# Patient Record
Sex: Female | Born: 1960 | Hispanic: No | Marital: Married | Smoking: Never smoker
Health system: Southern US, Community
[De-identification: ages and names within clinical notes are randomized; demographics above are authoritative.]

## PROBLEM LIST (undated history)

## (undated) DIAGNOSIS — E079 Disorder of thyroid, unspecified: Secondary | ICD-10-CM

---

## 1999-06-10 ENCOUNTER — Emergency Department (HOSPITAL_COMMUNITY): Admission: EM | Admit: 1999-06-10 | Discharge: 1999-06-10 | Payer: Self-pay

## 2003-11-19 ENCOUNTER — Ambulatory Visit (HOSPITAL_COMMUNITY): Admission: RE | Admit: 2003-11-19 | Discharge: 2003-11-19 | Payer: Self-pay | Admitting: Internal Medicine

## 2003-11-20 ENCOUNTER — Ambulatory Visit: Payer: Self-pay | Admitting: Family Medicine

## 2003-11-20 ENCOUNTER — Ambulatory Visit: Payer: Self-pay | Admitting: *Deleted

## 2004-05-15 ENCOUNTER — Ambulatory Visit: Payer: Self-pay | Admitting: Family Medicine

## 2013-06-10 ENCOUNTER — Encounter (HOSPITAL_COMMUNITY): Payer: Self-pay | Admitting: Emergency Medicine

## 2013-06-10 ENCOUNTER — Emergency Department (HOSPITAL_COMMUNITY)
Admission: EM | Admit: 2013-06-10 | Discharge: 2013-06-10 | Disposition: A | Discharge status: Home / Self Care | Payer: Self-pay | Source: Home / Self Care

## 2013-06-10 DIAGNOSIS — M25569 Pain in unspecified knee: Secondary | ICD-10-CM

## 2013-06-10 DIAGNOSIS — M76892 Other specified enthesopathies of left lower limb, excluding foot: Secondary | ICD-10-CM

## 2013-06-10 DIAGNOSIS — M25562 Pain in left knee: Secondary | ICD-10-CM

## 2013-06-10 DIAGNOSIS — M6580 Other synovitis and tenosynovitis, unspecified site: Secondary | ICD-10-CM

## 2013-06-10 DIAGNOSIS — X58XXXA Exposure to other specified factors, initial encounter: Secondary | ICD-10-CM

## 2013-06-10 DIAGNOSIS — S76312A Strain of muscle, fascia and tendon of the posterior muscle group at thigh level, left thigh, initial encounter: Secondary | ICD-10-CM

## 2013-06-10 DIAGNOSIS — IMO0002 Reserved for concepts with insufficient information to code with codable children: Secondary | ICD-10-CM

## 2013-06-10 DIAGNOSIS — G8929 Other chronic pain: Secondary | ICD-10-CM

## 2013-06-10 HISTORY — DX: Disorder of thyroid, unspecified: E07.9

## 2013-06-10 MED ORDER — TRAMADOL HCL 50 MG PO TABS: 50.0000 mg | ORAL_TABLET | Freq: Four times a day (QID) | ORAL | Status: DC | PRN

## 2013-06-10 NOTE — ED Notes (Deleted)
Pt  Reports  Symptoms  Of  sotrethroat  Wit  White  Patches  Back of  Throat   With  Symptoms  For  Several  Days   Pt          Appears  In no  Acute  Distress    Sitting  Upright on  Exam table  Speaking in  Complete  sentances

## 2013-06-10 NOTE — ED Provider Notes (Signed)
CSN: [id number]     Arrival date & time 06/10/13  1901 History   First MD Initiated Contact with Patient 06/10/13 2004     Chief Complaint  Patient presents with  . Leg Pain   (Consider location/radiation/quality/duration/timing/severity/associated sxs/prior Treatment) HPI Comments: 53 year old Spanish female with chronic left knee pain previously diagnosed with chronic hamstring tendinitis. She has been seeing a physician for several months and is not improving. Today she was taking a step down and produced acute pain in the back of the knee. She did not fall. She does not  described in overt extension or torsion type injury. She is also requesting that we do something for her chronic knee pain and she is not satisfied with her current physician.    Past Medical History  Diagnosis Date  . Thyroid disease    History reviewed. No pertinent past surgical history. History reviewed. No pertinent family history. History  Substance Use Topics  . Smoking status: Never Smoker   . Smokeless tobacco: Not on file  . Alcohol Use: Yes   OB History   Grav Para Term Preterm Abortions TAB SAB Ect Mult Living                 Review of Systems  Constitutional: Negative for fever, chills and activity change.  HENT: Negative.   Respiratory: Negative.   Cardiovascular: Negative.   Musculoskeletal: Negative for joint swelling.       As per HPI  Skin: Negative for color change, pallor and rash.  Neurological: Negative.     Allergies  Review of patient's allergies indicates no known allergies.  Home Medications   Prior to Admission medications   Medication Sig Start Date End Date Taking? Authorizing Provider  DiphenhydrAMINE HCl (BENADRYL PO) Take by mouth.    Historical Provider, MD  Ibuprofen (ADVIL PO) Take by mouth.    Historical Provider, MD   BP 125/80  Pulse 76  Temp(Src) 98.9 F (37.2 C) (Oral)  Resp 18  SpO2 96% Physical Exam  Nursing note and vitals  reviewed. Constitutional: She is oriented to person, place, and time. She appears well-developed and well-nourished. No distress.  HENT:  Head: Normocephalic and atraumatic.  Neck: Normal range of motion. Neck supple.  Pulmonary/Chest: Effort normal. No respiratory distress.  Musculoskeletal:  No bony tenderness. No deformity or discoloration. Full range of motion. Tenderness to the posterior fossa hamstring tendons. There is also tenderness to the distal hamstring muscle. No joint laxity. Distal neurovascular motor sensory is intact.  Neurological: She is alert and oriented to person, place, and time. No cranial nerve deficit.  Skin: Skin is warm and dry.    ED Course  Procedures (including critical care time) Labs Review Labs Reviewed - No data to display  No results found for this or any previous visit. Imaging Review No results found.   MDM   1. Tendinitis of left knee   2. Left hamstring muscle strain   3. Chronic pain of left knee    Applied to the area of soreness behind the knee Tylenol for pain Continue the diclofenac PC. Also start omeprazole daily. Refer to Dr. Sharol Given as requested    Janne Napoleon, NP 06/10/13 2024

## 2013-06-10 NOTE — ED Notes (Signed)
Pt c/o left leg pain/posterior to knee pain onset 1300 Reports she was going upstairs when she took a misstep and hurt her leg Reports hx of tendonitis; pain increases w/activity Brought back in wheelchair; cane also present Alert w/no signs of acute distress.

## 2013-06-10 NOTE — ED Notes (Signed)
Above  Notes  By  This  Probation officer  Charted  In  Error

## 2013-06-10 NOTE — Discharge Instructions (Signed)
Hamstring Strain  Hamstrings are the large muscles in the back of the thighs. A strain or tear injury happens when there is a sudden stretch or pull on these muscles and tendons. Tendons are cord like structures that attach muscle to bone. These injuries are commonly seen in activities such as sprinting due to sudden acceleration.  DIAGNOSIS  Often the diagnosis can be made by examination. HOME CARE INSTRUCTIONS   Apply ice to the sore area for 15-30minutes, 03-04 times per day. Do this while awake for the first 2 days. Put the ice in a plastic bag, and place a towel between the bag of ice and your skin.  Keep your knee flexed when possible. This means your foot is held off the ground slightly if you are on crutches. When lying down, a pillow under the knee will take strain off the muscles and provide some relief.  If a compression bandage such as an ace wrap was applied, use it until you are seen again. You may remove it for sleeping, showers and baths. If the wrap seems to be too tight and is uncomfortable, wrap it more loosely. If your toes or foot are getting cold or blue, it is too tight.  Walk or move around as the pain allows, or as instructed. Resume full activities as suggested by your caregiver. This is often safest when the strength of the injured leg has nearly returned to normal.  Only take over-the-counter or prescription medicines for pain, discomfort, or fever as directed by your caregiver. SEEK MEDICAL CARE IF:   You have an increase in bruising, swelling or pain.  You notice coldness or blueness of your toes or foot.  Pain relief is not obtained with medications.  You have increasing pain in the area and seem to be getting worse rather than better.  You notice your thigh getting larger in size (this could indicate bleeding into the muscle). Document Released: 11/04/2000 Document Revised: 05/04/2011 Document Reviewed: 02/12/2008 [facility] Patient Information 2014  [place], [place].  Knee Pain Knee pain can be a result of an injury or other medical conditions. Treatment will depend on the cause of your pain. HOME CARE  Only take medicine as told by your doctor.  Keep a healthy weight. Being overweight can make the knee hurt more.  Stretch before exercising or playing sports.  If there is constant knee pain, change the way you exercise. Ask your doctor for advice.  Make sure shoes fit well. Choose the right shoe for the sport or activity.  Protect your knees. Wear kneepads if needed.  Rest when you are tired. GET HELP RIGHT AWAY IF:   Your knee pain does not stop.  Your knee pain does not get better.  Your knee joint feels hot to the touch.  You have a fever. MAKE SURE YOU:   Understand these instructions.  Will watch this condition.  Will get help right away if you are not doing well or get worse. Document Released: 05/08/2008 Document Revised: 05/04/2011 Document Reviewed: 05/08/2008 [facility] Patient Information 2014 [place], [place].  Tendinitis Tendinitis is swelling and inflammation of the tendons. Tendons are band-like tissues that connect muscle to bone. Tendinitis commonly occurs in the:   Shoulders (rotator cuff).  Heels (Achilles tendon).  Elbows (triceps tendon). CAUSES Tendinitis is usually caused by overusing the tendon, muscles, and joints involved. When the tissue surrounding a tendon (synovium) becomes inflamed, it is called tenosynovitis. Tendinitis commonly develops in people whose jobs require repetitive motions. SYMPTOMS  Pain.  Tenderness.  Mild swelling. DIAGNOSIS Tendinitis is usually diagnosed by physical exam. Your caregiver may also order X-rays or other imaging tests. TREATMENT Your caregiver may recommend certain medicines or exercises for your treatment. HOME CARE INSTRUCTIONS   Use a sling or splint for as long as directed by your caregiver until the pain decreases.  Put ice on the injured  area.  Put ice in a plastic bag.  Place a towel between your skin and the bag.  Leave the ice on for 15-20 minutes, 03-04 times a day.  Avoid using the limb while the tendon is painful. Perform gentle range of motion exercises only as directed by your caregiver. Stop exercises if pain or discomfort increase, unless directed otherwise by your caregiver.  Only take over-the-counter or prescription medicines for pain, discomfort, or fever as directed by your caregiver. SEEK MEDICAL CARE IF:   Your pain and swelling increase.  You develop new, unexplained symptoms, especially increased numbness in the hands. MAKE SURE YOU:   Understand these instructions.  Will watch your condition.  Will get help right away if you are not doing well or get worse. Document Released: 02/07/2000 Document Revised: 05/04/2011 Document Reviewed: 04/28/2010 [facility] Patient Information 2014 [place], [place].

## 2013-06-12 NOTE — ED Provider Notes (Signed)
Medical screening examination/treatment/procedure(s) were performed by a resident physician or non-physician practitioner and as the supervising physician I was immediately available for consultation/collaboration.  Lynne Leader, MD    Gregor Hams, MD 06/12/13 [phone]

## 2014-03-18 ENCOUNTER — Emergency Department (HOSPITAL_COMMUNITY): Payer: Self-pay

## 2014-03-18 ENCOUNTER — Emergency Department (HOSPITAL_COMMUNITY)
Admission: EM | Admit: 2014-03-18 | Discharge: 2014-03-18 | Disposition: A | Discharge status: Home / Self Care | Payer: Self-pay | Attending: Emergency Medicine | Admitting: Emergency Medicine

## 2014-03-18 ENCOUNTER — Encounter (HOSPITAL_COMMUNITY): Payer: Self-pay | Admitting: Family Medicine

## 2014-03-18 DIAGNOSIS — H81399 Other peripheral vertigo, unspecified ear: Secondary | ICD-10-CM | POA: Insufficient documentation

## 2014-03-18 DIAGNOSIS — R110 Nausea: Secondary | ICD-10-CM | POA: Insufficient documentation

## 2014-03-18 DIAGNOSIS — E876 Hypokalemia: Secondary | ICD-10-CM | POA: Insufficient documentation

## 2014-03-18 DIAGNOSIS — Z8639 Personal history of other endocrine, nutritional and metabolic disease: Secondary | ICD-10-CM | POA: Insufficient documentation

## 2014-03-18 DIAGNOSIS — R42 Dizziness and giddiness: Secondary | ICD-10-CM

## 2014-03-18 LAB — CBC
HEMATOCRIT: 39.2 % (ref 36.0–46.0)
Hemoglobin: 13.6 g/dL (ref 12.0–15.0)
MCH: 29.8 pg (ref 26.0–34.0)
MCHC: 34.7 g/dL (ref 30.0–36.0)
MCV: 85.8 fL (ref 78.0–100.0)
Platelets: 250 10*3/uL (ref 150–400)
RBC: 4.57 MIL/uL (ref 3.87–5.11)
RDW: 13.7 % (ref 11.5–15.5)
WBC: 4.7 10*3/uL (ref 4.0–10.5)

## 2014-03-18 LAB — BASIC METABOLIC PANEL
ANION GAP: 8 (ref 5–15)
BUN: 10 mg/dL (ref 6–23)
CHLORIDE: 105 mmol/L (ref 96–112)
CO2: 25 mmol/L (ref 19–32)
CREATININE: 0.55 mg/dL (ref 0.50–1.10)
Calcium: 9.2 mg/dL (ref 8.4–10.5)
GFR calc Af Amer: >90 mL/min (ref >90)
GLUCOSE: 101 mg/dL — AB (ref 70–99)
Potassium: 3.4 mmol/L — ABNORMAL LOW (ref 3.5–5.1)
SODIUM: 138 mmol/L (ref 135–145)

## 2014-03-18 MED ORDER — MECLIZINE HCL 50 MG PO TABS: 25.0000 mg | ORAL_TABLET | Freq: Three times a day (TID) | ORAL | Status: DC | PRN

## 2014-03-18 MED ORDER — MECLIZINE HCL 25 MG PO TABS
25.0000 mg | ORAL_TABLET | Freq: Once | ORAL | Status: AC
  Administered 2014-03-18: 25 mg via ORAL
  Filled 2014-03-18: qty 1

## 2014-03-18 NOTE — ED Provider Notes (Signed)
CSN: [id number]     Arrival date & time 03/18/14  1407 History   First MD Initiated Contact with Patient 03/18/14 1646     Chief Complaint  Patient presents with  . Dizziness     (Consider location/radiation/quality/duration/timing/severity/associated sxs/prior Treatment) Patient is a 54 y.o. female presenting with dizziness. The history is provided by the patient. No language interpreter was used.  Dizziness Quality:  Room spinning Severity:  Severe Onset quality:  Sudden Duration: First episode 6 months ago, then 2 months ago.  Current episode started 3 days ago. Timing:  Intermittent Progression:  Waxing and waning Chronicity:  Recurrent Context: bending over, head movement and standing up   Relieved by:  Being still Worsened by:  Movement Associated symptoms: nausea   Associated symptoms: no chest pain, no diarrhea, no headaches, no hearing loss, no palpitations, no shortness of breath, no syncope, no tinnitus, no vision changes and no vomiting     Past Medical History  Diagnosis Date  . Thyroid disease    History reviewed. No pertinent past surgical history. History reviewed. No pertinent family history. History  Substance Use Topics  . Smoking status: Never Smoker   . Smokeless tobacco: Not on file  . Alcohol Use: Yes   OB History    No data available     Review of Systems  Constitutional: Negative for fever, chills, diaphoresis, activity change, appetite change and fatigue.  HENT: Negative for congestion, facial swelling, hearing loss, rhinorrhea, sore throat and tinnitus.   Eyes: Negative for photophobia and discharge.  Respiratory: Negative for cough, chest tightness and shortness of breath.   Cardiovascular: Negative for chest pain, palpitations, leg swelling and syncope.  Gastrointestinal: Positive for nausea. Negative for vomiting, abdominal pain and diarrhea.  Endocrine: Negative for polydipsia and polyuria.  Genitourinary: Negative for dysuria,  frequency, difficulty urinating and pelvic pain.  Musculoskeletal: Negative for back pain, arthralgias, neck pain and neck stiffness.  Skin: Negative for color change and wound.  Allergic/Immunologic: Negative for immunocompromised state.  Neurological: Positive for dizziness. Negative for facial asymmetry, weakness, numbness and headaches.  Hematological: Does not bruise/bleed easily.  Psychiatric/Behavioral: Negative for confusion and agitation.      Allergies  Review of patient's allergies indicates no known allergies.  Home Medications   Prior to Admission medications   Medication Sig Start Date End Date Taking? Authorizing Provider  DiphenhydrAMINE HCl (BENADRYL PO) Take by mouth.    Historical Provider, MD  Ibuprofen (ADVIL PO) Take by mouth.    Historical Provider, MD  meclizine (ANTIVERT) 50 MG tablet Take 0.5 tablets (25 mg total) by mouth 3 (three) times daily as needed for dizziness. 03/18/14   Ernestina Patches, MD  traMADol (ULTRAM) 50 MG tablet Take 1 tablet (50 mg total) by mouth every 6 (six) hours as needed. 06/10/13   Janne Napoleon, NP   BP 113/57 mmHg  Pulse 63  Temp(Src) 98.1 F (36.7 C) (Oral)  Resp 18  Wt 144 lb (65.318 kg)  SpO2 99% Physical Exam  Constitutional: She is oriented to person, place, and time. She appears well-developed and well-nourished. No distress.  HENT:  Head: Normocephalic and atraumatic.  Mouth/Throat: No oropharyngeal exudate.  Eyes: Pupils are equal, round, and reactive to light.  Neck: Normal range of motion. Neck supple.  Cardiovascular: Normal rate, regular rhythm and normal heart sounds.  Exam reveals no gallop and no friction rub.   No murmur heard. Pulmonary/Chest: Effort normal and breath sounds normal. No respiratory distress. She has no  wheezes. She has no rales.  Abdominal: Soft. Bowel sounds are normal. She exhibits no distension and no mass. There is no tenderness. There is no rebound and no guarding.  Musculoskeletal: Normal  range of motion. She exhibits no edema or tenderness.  Neurological: She is alert and oriented to person, place, and time. She has normal strength. She displays no atrophy and no tremor. No cranial nerve deficit or sensory deficit. She exhibits normal muscle tone. She displays a negative Romberg sign. Coordination and gait normal. GCS eye subscore is 4. GCS verbal subscore is 5. GCS motor subscore is 6.  Mildly symptomatic Dix Hallpike on the left more strongly positive on right.  No nystagmus appreciated bilaterally  Skin: Skin is warm and dry.  Psychiatric: She has a normal mood and affect.    ED Course  Procedures (including critical care time) Labs Review Labs Reviewed  BASIC METABOLIC PANEL - Abnormal; Notable for the following:    Potassium 3.4 (*)    Glucose, Bld 101 (*)    All other components within normal limits  CBC    Imaging Review Ct Head Wo Contrast  03/18/2014   CLINICAL DATA:  dizziness over the past few months. sts random and when she moves her head  No hx of ca  EXAM: CT HEAD WITHOUT CONTRAST  TECHNIQUE: Contiguous axial images were obtained from the base of the skull through the vertex without intravenous contrast.  COMPARISON:  None.  FINDINGS: Ventricles normal in size and configuration. There are no parenchymal masses or mass effect. There are no areas of abnormal parenchymal attenuation. No evidence of an infarct.  There are no extra-axial masses or abnormal fluid collections.  There is no intracranial hemorrhage.  Visualized sinuses and mastoid air cells are clear. No skull lesion.  IMPRESSION: Normal unenhanced CT scan the brain   Electronically Signed   By: Lajean Manes M.D.   On: 03/18/2014 19:00     EKG Interpretation   Date/Time:  Sunday March 18 2014 14:11:32 EST Ventricular Rate:  71 PR Interval:  132 QRS Duration: 68 QT Interval:  404 QTC Calculation: 439 R Axis:   80 Text Interpretation:  Normal sinus rhythm Low voltage QRS Nonspecific ST   abnormality Abnormal ECG No prior for comparison Confirmed by DOCHERTY   MD, MEGAN ([id number]) on 03/18/2014 4:47:37 PM      MDM   Final diagnoses:  Vertigo  Peripheral vertigo, unspecified laterality    Pt is a 54 y.o. female with Pmhx as above who presents with episodic vertigo, worse with head movement and change in position.  She's had no recent head injuries or falls.  No numbness, weakness, confusion, decreased hearing tinnitus, ear pain.  On physical exam, vital signs are stable and she is in no acute distress.  Dix-Hallpike is mildly positive on the right more strongly positive (I cannot appreciate nystagmus.  Neuro exam otherwise unremarkable.  CBC and BMP from triage with mild hypokalemia, otherwise unremarkable.  CT head nml.  Suspect BPPV.  Will give trial prescription for meclizine and also instructions for home Epley maneuver.  [facility] as well as to ENT for continued symptoms.     Hassell Halim evaluation in the Emergency Department is complete. It has been determined that no acute conditions requiring further emergency intervention are present at this time. The patient/guardian have been advised of the diagnosis and plan. We have discussed signs and symptoms that  warrant return to the ED, such as changes or worsening in symptoms, numbness, weakness, confusion,       Ernestina Patches, MD 03/18/14 1921

## 2014-03-18 NOTE — ED Notes (Signed)
Pt sts intermittent dizziness over the past few months. sts random and when she moves her head fast.

## 2014-03-18 NOTE — ED Notes (Signed)
MD Docherty at bedside. 

## 2014-03-18 NOTE — Discharge Instructions (Signed)
Vrtigo postural benigno (Benign Positional Vertigo)  Vrtigo es la sensacin de que el entorno se mueve estando quieto. Es la forma ms frecuente de vrtigo. Benigno significa que la causa del trastorno no es grave. Es ms frecuente en adultos mayores. CAUSAS  Es el resultado de un trastorno en el sistema laberntico. Es una zona en el odo medio que ayuda a controlar el equilibrio. La causa puede ser una infeccin viral, una lesin en la cabeza o un movimiento repetitivo. Sin embargo, a menudo no se Research scientist (life sciences).  SNTOMAS  Los sntomas de vrtigo posicional benigno se producen al mover la cabeza o los ojos en diferentes direcciones. Algunos de los sntomas pueden ser:  1. Prdida de equilibrio y cadas. 2. Vmitos. 3. Visin borrosa. 4. Mareos. 5. Nuseas. 6. Movimientos oculares involuntarios (nistagmus). DIAGNSTICO  El vrtigo postural benigno se diagnostica mediante un examen fsico. Si la causa especfica de su vrtigo posicional benigno es desconocido, su mdico puede indicar diagnsticos por imgenes, como la Health visitor (RM) o la tomografa computada (TC).  TRATAMIENTO  El [employer] podr recomendar movimientos o procedimientos para corregir el vrtigo posicional benigno. Para tratar los sntomas pueden indicarse medicamentos como meclizina, benzodiazepinas y medicamentos para las nuseas. En casos raros, si los sntomas son causados   por ciertos trastornos que afectan el odo interno, es posible que necesite [place].  [place] indicaciones del mdico.  Muvase lentamente. No haga movimientos bruscos con la cabeza ni el cuerpo.  Evite conducir vehculos.  Evite operar maquinarias pesadas.  Evite realizar tareas que seran peligrosas para usted u otras personas durante un episodio de vrtigo.  Debe ingerir gran cantidad de lquido para mantener la orina de tono claro o color amarillo plido. SOLICITE ATENCIN [place] DE  INMEDIATO SI:   Tiene dificultad para hablar, caminar, siente debilidad o tiene problemas para usar los brazos, las [place] piernas.  Tiene dificultad para respirar.  Sufre un dolor de cabeza intenso.  Las nuseas o los vmitos persisten o [place].  Tiene cambios en la visin.  Sus familiares o amigos notan cambios en su conducta.  El dolor [place].  Tiene fiebre.  Comienza a sentir rigidez en el cuello o sensibilidad a la luz. ASEGRESE DE QUE:   Comprende estas instrucciones.  Controlar su enfermedad.  Solicitar ayuda de inmediato si no mejora o si empeora. Document Released: 05/28/2008 Document Revised: 05/04/2011 [facility] Patient Information 2015 [place]. This information is not intended to replace advice given to you by your health care provider. Make sure you discuss any questions you have with your health care provider. Benign Positional Vertigo Vertigo means you feel like you or your surroundings are moving when they are not. Benign positional vertigo is the most common form of vertigo. Benign means that the cause of your condition is not serious. Benign positional vertigo is more common in older adults. CAUSES  Benign positional vertigo is the result of an upset in the labyrinth system. This is an area in the middle ear that helps control your balance. This may be caused by a viral infection, head injury, or repetitive motion. However, often no specific cause is found. SYMPTOMS  Symptoms of benign positional vertigo occur when you move your head or eyes in different directions. Some of the symptoms may include: 7. Loss of balance and falls. 8. Vomiting. 9. Blurred vision. 10. Dizziness. 11. Nausea. 12. Involuntary eye movements (nystagmus). DIAGNOSIS  Benign positional  vertigo is usually diagnosed by physical exam. If the specific cause of your benign positional vertigo is unknown, your caregiver may perform imaging tests, such as magnetic resonance  imaging (MRI) or computed tomography (CT). TREATMENT  Your caregiver may recommend movements or procedures to correct the benign positional vertigo. Medicines such as meclizine, benzodiazepines, and medicines for nausea may be used to treat your symptoms. In rare cases, if your symptoms are caused by certain conditions that affect the inner ear, you may need surgery. HOME CARE INSTRUCTIONS   Follow your caregiver's instructions.  Move slowly. Do not make sudden body or head movements.  Avoid driving.  Avoid operating heavy machinery.  Avoid performing any tasks that would be dangerous to you or others during a vertigo episode.  Drink enough fluids to keep your urine clear or pale yellow. SEEK IMMEDIATE MEDICAL CARE IF:   You develop problems with walking, weakness, numbness, or using your arms, hands, or legs.  You have difficulty speaking.  You develop severe headaches.  Your nausea or vomiting continues or gets worse.  You develop visual changes.  Your family or friends notice any behavioral changes.  Your condition gets worse.  You have a fever.  You develop a stiff neck or sensitivity to light. MAKE SURE YOU:   Understand these instructions.  Will watch your condition.  Will get help right away if you are not doing well or get worse. Document Released: 11/17/2005 Document Revised: 05/04/2011 Document Reviewed: 10/30/2010 [facility] Patient Information 2015 [place], [place]. This information is not intended to replace advice given to you by your health care provider. Make sure you discuss any questions you have with your health care provider. Maniobra de Engineer, petroleum, cuidado personal Immunologist Self-Care) QU ES LA MANIOBRA DE EPLEY? La Yahoo de [place]ple[place] es un ejercicio que puede Optometrist para Public house manager los sntomas del vrtigo posicional paroxstico benigno (VPPB). Esta afeccin a menudo se conoce como vrtigo. El movimiento de unos pequeos cristales (canalculos)  dentro del odo interno ocasiona el VPPB. La acumulacin y el movimiento de los canalculos en el odo interno ocasionan una repentina sensacin de aceleracin (vrtigo) cuando se mueve la cabeza en ciertas posiciones. El vrtigo por lo general dura unos 30das. El VPPB normalmente ocurre solo en un odo. Si siente vrtigo cuando se recuesta sobre el lado izquierdo, probablemente tenga VPPB en el odo izquierdo. El mdico le puede decir qu odo est involucrado.  Una lesin en la cabeza puede ocasionar el VPPB. Muchas personas de ms de 50aos tienen VPPB por motivos desconocidos. Si se le diagnostic VPPB, el mdico puede ensearle a Runner, broadcasting/film/video. El VPPB no es potencialmente mortal (benigno) y normalmente se pasa con el tiempo.  [place]? Puede realizar [employer] en su casa cuando tenga los sntomas de vrtigo. Puede realizar Neurosurgeon de [place]ple[place] hasta 3veces en un da hasta que los sntomas de vrtigo desaparezcan. [place] DE EPLEY? 13. Sintese en el borde de una cama o una mesa con la espalda recta. Las piernas deben estar extendidas o colgando sobre el borde de la cama o la mesa. 14. Gire la cabeza a medias hacia el lado del odo afectado. 15. Recustese hacia atrs con la cabeza girada hasta que se encuentre recostado sobre la espalda. Quizs quiera colocar una almohada debajo de los hombros. 16. Mantenga esta posicin durante 30segundos. Es posible que experimente un ataque de vrtigo. Esto es normal. Mantenga esta posicin hasta que el  vrtigo desaparezca. 17. Luego gire la cabeza en direccin opuesta hasta que el odo no afectado est orientado al suelo. 18. Mantenga esta posicin durante 30segundos. Es posible que experimente un ataque de vrtigo. Esto es normal. Mantenga esta posicin hasta que el vrtigo desaparezca. 30. Ahora gire todo el cuerpo hacia el mismo lado que la [place]. Mantenga esta posicin durante  otros 30segundos. 20. Luego, vuelva a sentarse. ESTA MANIOBRA PRESENTA RIESGOS? En algunos casos, puede tener otros sntomas (como cambios en la visin, debilidad o entumecimiento). Si tiene estos sntomas, deje de Statistician y llame al mdico. Memory Dance si realizar esta maniobra lo [place] del vrtigo, es posible que sienta mareos. El mareo es la sensacin de desvanecimiento pero sin la sensacin de [place]. Aunque la Yahoo de Engineer, petroleum lo [place] del vrtigo, es posible que los sntomas vuelvan durante los siguientes 5aos. QU DEBO HACER DESPUS DE ESTA [place]? Puede retomar sus actividades normales despus de Optometrist la [place] de Engineer, petroleum. Pregntele al mdico si debe hacer algo en su casa para prevenir el vrtigo. Esta puede incluir:  Dormir con dos o ms almohadas para Theatre manager la cabeza elevada.  No dormir sobre el lado del odo afectado.  Levantarse lentamente de la cama.  Evitar los movimientos repentinos Agricultural consultant.  Evitar los movimientos de cabeza intensos, como mirar hacia arriba o Office manager.  Utilizar un collar cervical para evitar los movimientos de cabeza repentinos. [place] SI LOS SNTOMAS EMPEORAN? Llame al mdico si el vrtigo empeora. Llame al mdico inmediatamente si tiene otros sntomas, incluidos:   Nuseas.  Vmitos.  Dolor de [place].  Debilidad.  Entumecimiento.  Cambios en la visin. Document Released: 02/14/2013 [facility] Patient Information 2015 [place]. This information is not intended to replace advice given to you by your health care provider. Make sure you discuss any questions you have with your health care provider.

## 2014-11-29 ENCOUNTER — Other Ambulatory Visit (HOSPITAL_COMMUNITY)
Admission: RE | Admit: 2014-11-29 | Discharge: 2014-11-29 | Disposition: A | Discharge status: Home / Self Care | Payer: Self-pay | Source: Ambulatory Visit | Attending: Obstetrics & Gynecology | Admitting: Obstetrics & Gynecology

## 2014-11-29 ENCOUNTER — Encounter: Payer: Self-pay | Admitting: Obstetrics & Gynecology

## 2014-11-29 ENCOUNTER — Ambulatory Visit (INDEPENDENT_AMBULATORY_CARE_PROVIDER_SITE_OTHER): Payer: Self-pay | Admitting: Obstetrics & Gynecology

## 2014-11-29 VITALS — BP 119/72 | HR 70 | Temp 98.0°F | Ht 60.0 in | Wt 148.6 lb

## 2014-11-29 DIAGNOSIS — N841 Polyp of cervix uteri: Secondary | ICD-10-CM | POA: Insufficient documentation

## 2014-11-29 DIAGNOSIS — N950 Postmenopausal bleeding: Secondary | ICD-10-CM | POA: Insufficient documentation

## 2014-11-29 DIAGNOSIS — R102 Pelvic and perineal pain: Secondary | ICD-10-CM

## 2014-11-29 NOTE — Progress Notes (Signed)
Pt given # to [facility] and [facility] for PCP, management of thyroid.  Pt filled out Mammogram Scholarship and faxed to Radiology.    [place] scheduled of October 13th @ 1500.

## 2014-11-29 NOTE — Patient Instructions (Signed)
Hemorragia postmenopusica (Postmenopausal Bleeding) El sangrado postmenopusico es el sangrado que tiene una mujer despus de haber entrado en la menopausia. La menopausia es el final de la edad frtil de la [place]. Despus de la menopausia, una mujer deja de ovular y de tener perodos [place].  La hemorragia postmenopusica puede tener varias causas. Cualquier tipo de hemorragia postmenopusica, incluso si parece ser un perodo menstrual tpico, es preocupante. Esto lo evaluar el mdico. Cualquier tratamiento depender de la causa del sangrado. INSTRUCCIONES PARA EL CUIDADO EN EL HOGAR Controle su afeccin para ver si hay cambios. Las siguientes indicaciones ayudarn a Chief Strategy Officer que pueda sentir:  Evite las duchas vaginales y el uso de tampones segn lo que le indique su mdico.  [place] compresas con frecuencia.  Hgase exmenes plvicos regulares y pruebas de [place].  [place] con todas las visitas de control, segn le indique su mdico. SOLICITE ATENCIN MDICA SI:   El sangrado dura ms de 1 semana.  Siente dolor abdominal.  Tiene hemorragias [place]. SOLICITE ATENCIN MDICA DE INMEDIATO SI:   Usted tiene fiebre, escalofros, mareos, dolor de cabeza, dolores musculares y Pensions consultant.  Tiene dolor con el sangrado.  Elimina cogulos de [place].  Tiene sangrado y necesita ms de un apsito por hora.  Siente que va a desmayarse. ASEGRESE DE QUE:  Comprende estas instrucciones.  Controlar su afeccin.  Recibir ayuda de inmediato si no mejora o si empeora.   Esta informacin no tiene Marine scientist el consejo del mdico. Asegrese de hacerle al mdico cualquier pregunta que tenga.   Document Released: 07/29/2007 Document Revised: 11/30/2012 Elsevier Interactive Patient Education [employer].

## 2014-11-29 NOTE — Progress Notes (Signed)
CLINIC ENCOUNTER NOTE  History:  54 y.o. PMP F here today for evaluation of LLQ pain and postmenopausal bleeding attributed to cervical polyp sen on exam by her primary care provider. Patient is Spanish-speaking and English-speaking Spanish interpreter present for this encounter.  She is also accompanied by her daughter.  Pelvic pain has been present for several months, can be moderate-severe in intensity, exacerbated by heavy lifting or strenous activity. Not associated with bleeding.  She also noted a couple of episodes of spotting over past two months, was noted to have cervical polyp on exam.   She denies any abnormal vaginal discharge, bleeding or other concerns.   Past Medical History  Diagnosis Date  . Thyroid disease     No past surgical history on file.  The following portions of the patient's history were reviewed and updated as appropriate: allergies, current medications, past family history, past medical history, past social history, past surgical history and problem list.   Health Maintenance:  Normal pap last year as per patient.  Normal mammogram a few years ago.  Review of Systems:  Pertinent items noted in HPI and remainder of comprehensive ROS otherwise negative.  Objective:  Physical Exam BP 119/72 mmHg  Pulse 70  Temp(Src) 98 F (36.7 C) (Oral)  Ht 5' (1.524 m)  Wt 148 lb 9.6 oz (67.405 kg)  BMI 29.02 kg/m2 CONSTITUTIONAL: Well-developed, well-nourished female in no acute distress.  HENT:  Normocephalic, atraumatic. External right and left ear normal. Oropharynx is clear and moist EYES: Conjunctivae and EOM are normal. Pupils are equal, round, and reactive to light. No scleral icterus.  NECK: Normal range of motion, supple, no masses SKIN: Skin is warm and dry. No rash noted. Not diaphoretic. No erythema. No pallor. [place]: Alert and oriented to person, place, and time. Normal reflexes, muscle tone coordination. No cranial nerve deficit noted. PSYCHIATRIC:  Normal mood and affect. Normal behavior. Normal judgment and thought content. CARDIOVASCULAR: Normal heart rate noted RESPIRATORY: Effort and breath sounds normal, no problems with respiration noted ABDOMEN: Soft, no distention noted.  Vertical incision noted in lower abdomen and diffuse lower abdominal tenderness to palpation especially on the left side. PELVIC: Normal appearing external genitalia; normal appearing vaginal mucosa and cervix. 8 mm pink polypoid lesion noted in external os.  No abnormal discharge noted. Unable to palpate uterus or adnexa secondary patient's discomfort during pelvic exam; she was screaming throughout exam. MUSCULOSKELETAL: Normal range of motion. No edema noted.  CERVICAL POLYPECTOMY NOTE Verbal consent was obtained from patient.  The patient's cervix was prepped with Betadine.  Ring forceps were used to grasp the polypoid lesion and the portion of the lesion was removed by twisting it off its base. Could not remove more portions as patient asked me to stop the procedure due to pain.  Tissue obtained was sent to pathology for analysis.  Small bleeding was noted and hemostasis was achieved using silver nitrate sticks.  The patient did not tolerate the procedure well. Post-procedure instructions were given to the patient. The patient is to call with heavy bleeding, fever greater than 100.4, foul smelling vaginal discharge or other concerns.   Assessment & Plan:  1. Pelvic pain in female Will evaluate for GYN structural etiology - US Pelvis Complete; Future - US Transvaginal Non-OB; Future  2. Cervical polyp - Surgical pathology  3. Postmenopausal bleeding Unable to do endometrial biopsy secondary to discomfort Will follow up polyp pathology and ultrasound Patient informed of possible need for endometrial biopsy in  the near future if spotting continues  Follow up in 2 weeks for discussion of results, possible endometrial biopsy  Routine preventative health  maintenance measures emphasized; given information for free pap smear clinics and filled out mammogram scholarship. Please refer to After Visit Summary for other counseling recommendations.    Total face-to-face time with patient: 20 minutes. Over 50% of encounter was spent on counseling and coordination of care.   Verita Schneiders, MD, [place] Attending Obstetrician & Gynecologist, [place] for [facility]

## 2014-11-30 ENCOUNTER — Encounter: Payer: Self-pay | Admitting: Obstetrics & Gynecology

## 2014-12-04 ENCOUNTER — Telehealth: Payer: Self-pay | Admitting: General Practice

## 2014-12-04 NOTE — Telephone Encounter (Signed)
Per Dr Harolyn Rutherford, Benign cervical polyp.Needs appointment to follow up in 2-3 weeks with me or any provider for her postmenopausal bleeding and discussion of results. Important that patient has ultrasound prior to next appt here. Currently scheduled for 10/13. Called patient with Earnest Bailey for interpreter, no answer- left message stating we are trying to reach you with results please call us back at the clinics

## 2014-12-05 NOTE — Telephone Encounter (Signed)
Attempted to contact patient to give results, Daugther Endy answered the call.  Patient not available, daughter will give message for Patient to call on 12/06/14.  Message sent to front office to make follow up appointment.

## 2014-12-06 ENCOUNTER — Ambulatory Visit (HOSPITAL_COMMUNITY)
Admission: RE | Admit: 2014-12-06 | Discharge: 2014-12-06 | Disposition: A | Discharge status: Home / Self Care | Payer: Self-pay | Source: Ambulatory Visit | Attending: Obstetrics & Gynecology | Admitting: Obstetrics & Gynecology

## 2014-12-06 DIAGNOSIS — D251 Intramural leiomyoma of uterus: Secondary | ICD-10-CM | POA: Insufficient documentation

## 2014-12-06 DIAGNOSIS — R1032 Left lower quadrant pain: Secondary | ICD-10-CM | POA: Insufficient documentation

## 2014-12-06 DIAGNOSIS — R102 Pelvic and perineal pain: Secondary | ICD-10-CM | POA: Insufficient documentation

## 2014-12-06 DIAGNOSIS — N950 Postmenopausal bleeding: Secondary | ICD-10-CM | POA: Insufficient documentation

## 2014-12-11 NOTE — Telephone Encounter (Signed)
Pt has appointment scheduled for 10/31.

## 2014-12-17 ENCOUNTER — Telehealth: Payer: Self-pay | Admitting: *Deleted

## 2014-12-17 NOTE — Telephone Encounter (Signed)
Per Dr. Harolyn Rutherford call patient to inform her of need for endometrial biopsy at visit on 10/31. Called patient and left message that we are calling with some information about her appointment.

## 2014-12-18 ENCOUNTER — Encounter: Payer: Self-pay | Admitting: *Deleted

## 2014-12-19 NOTE — Telephone Encounter (Signed)
Called pt and was not able to leave message as pt does not have voice mailbox set up.

## 2014-12-20 NOTE — Telephone Encounter (Signed)
Will inform patient of procedure at visit. Phone is not accepting messages at this time.

## 2014-12-24 ENCOUNTER — Ambulatory Visit (INDEPENDENT_AMBULATORY_CARE_PROVIDER_SITE_OTHER): Payer: Self-pay | Admitting: Obstetrics & Gynecology

## 2014-12-24 ENCOUNTER — Encounter: Payer: Self-pay | Admitting: Obstetrics & Gynecology

## 2014-12-24 VITALS — BP 134/89 | HR 78 | Temp 98.1°F | Ht 59.0 in | Wt 145.1 lb

## 2014-12-24 DIAGNOSIS — N950 Postmenopausal bleeding: Secondary | ICD-10-CM

## 2014-12-24 DIAGNOSIS — N841 Polyp of cervix uteri: Secondary | ICD-10-CM

## 2014-12-24 DIAGNOSIS — Z1239 Encounter for other screening for malignant neoplasm of breast: Secondary | ICD-10-CM

## 2014-12-24 LAB — POCT PREGNANCY, URINE: Preg Test, Ur: NEGATIVE

## 2014-12-24 NOTE — Patient Instructions (Signed)
Mammogram A mammogram is an X-ray of the breasts that is done to check for changes that are not normal. This test can screen for and find any changes that may suggest breast cancer. This test can also help to find other changes and variations in the breast. BEFORE THE PROCEDURE  Have this test done about 1-2 weeks after your period. This is usually when your breasts are the least tender.  If you are visiting a new doctor or clinic, send any past mammogram images to your new doctor's office.  Wash your breasts and under your arms the day of the test.  Do not use deodorants, perfumes, lotions, or powders on the day of the test.  Take off any jewelry from your neck.  Wear clothes that you can change into and out of easily. PROCEDURE  You will undress from the waist up. You will put on a gown.  You will stand in front of the X-ray machine.  Each breast will be placed between two plastic or glass plates. The plates will press down on your breast for a few seconds. Try to stay as relaxed as possible. This does not cause any harm to your breasts. Any discomfort you feel will be very brief.  X-rays will be taken from different angles of each breast. The procedure may vary among doctors and hospitals. AFTER THE PROCEDURE  The mammogram will be looked at by a specialist (radiologist).  You may need to do certain parts of the test again. This depends on the quality of the images.  Ask when your test results will be ready. Make sure you get your test results.  You may go back to your normal activities.   This information is not intended to replace advice given to you by your health care provider. Make sure you discuss any questions you have with your health care provider.   Document Released: 05/04/2011 Document Revised: 10/31/2014 Document Reviewed: 04/20/2014 Elsevier Interactive Patient Education [employer].

## 2014-12-24 NOTE — Progress Notes (Signed)
Subjective:     Patient ID: Stacy Patel, female   DOB: 1960-05-12, 54 y.o.   MRN: [id number] Cc: f/u for postmenopausal bleeding and polyp removal   HPI postmenopausal bleeding spotting, no bleeding since cervical polyp was removed. Pathology was benign. She does not want endometrial biopsy.  Past Medical History  Diagnosis Date  . Thyroid disease    No past surgical history on file. No Known Allergies Current Outpatient Prescriptions on File Prior to Visit  Medication Sig Dispense Refill  . DiphenhydrAMINE HCl (BENADRYL PO) Take by mouth.    . Ibuprofen (ADVIL PO) Take by mouth.    . levothyroxine (SYNTHROID, LEVOTHROID) 25 MCG tablet Take 25 mcg by mouth daily before breakfast.    . meclizine (ANTIVERT) 50 MG tablet Take 0.5 tablets (25 mg total) by mouth 3 (three) times daily as needed for dizziness. (Patient not taking: Reported on 11/29/2014) 30 tablet 0  . traMADol (ULTRAM) 50 MG tablet Take 1 tablet (50 mg total) by mouth every 6 (six) hours as needed. (Patient not taking: Reported on 11/29/2014) 20 tablet 0   No current facility-administered medications on file prior to visit.       Review of Systems  Constitutional: Negative.   Genitourinary: Negative for vaginal bleeding, vaginal discharge, menstrual problem and pelvic pain.       Objective:   Physical Exam  Constitutional: She is oriented to person, place, and time. She appears well-developed. No distress.  Cardiovascular: Normal rate.   Pulmonary/Chest: Effort normal.  Neurological: She is alert and oriented to person, place, and time.  Skin: No pallor.  Psychiatric: She has a normal mood and affect. Her behavior is normal.   CLINICAL DATA: Patient with left-sided pelvic pain. Left lower quadrant pain. Postmenopausal bleeding for 2 months.  EXAM: TRANSABDOMINAL AND TRANSVAGINAL ULTRASOUND OF PELVIS  TECHNIQUE: Both transabdominal and transvaginal ultrasound examinations of the pelvis were  performed. Transabdominal technique was performed for global imaging of the pelvis including uterus, ovaries, adnexal regions, and pelvic cul-de-sac. It was necessary to proceed with endovaginal exam following the transabdominal exam to visualize the endometrium and adnexal structures.  COMPARISON: None  FINDINGS: Uterus  Measurements: 6.0 x 4.6 x 4.9 cm. There is a 2.7 x 2.2 x 2.5 cm intramural fibroid within the anterior uterine body.  Endometrium  Thickness: 6 mm. No focal abnormality visualized.  Right ovary  Measurements: 2.3 x 1.3 x 1.8 cm. Normal appearance/no adnexal mass.  Left ovary  Measurements: 2.4 x 1.1 x 1.1 cm. Normal appearance/no adnexal mass.  Other findings  No free fluid.  IMPRESSION: Endometrium measures 6 mm. In the setting of post-menopausal bleeding, endometrial sampling is indicated to exclude carcinoma. If results are benign, sonohysterogram should be considered for focal lesion work-up. (Ref: Radiological Reasoning: Algorithmic Workup of Abnormal Vaginal Bleeding with Endovaginal Sonography and Sonohysterography. AJR 2008; 676:H20-94)  Fibroid uterus.   Electronically Signed  By: Lovey Newcomer M.D.  On: 12/06/2014 15:22     Assessment:     Postmenopausal spotting resolved after cervical polyp removed 6 mm endometrium  Declines endometrial biopsy      Plan:    Pap smear at cancer screening Mammogram ordered RTC 32mo Report if further bleeding

## 2017-08-25 ENCOUNTER — Ambulatory Visit (HOSPITAL_COMMUNITY)
Admission: EM | Admit: 2017-08-25 | Discharge: 2017-08-25 | Disposition: A | Discharge status: Home / Self Care | Payer: Self-pay | Attending: Internal Medicine | Admitting: Internal Medicine

## 2017-08-25 ENCOUNTER — Encounter (HOSPITAL_COMMUNITY): Payer: Self-pay | Admitting: Emergency Medicine

## 2017-08-25 DIAGNOSIS — M5489 Other dorsalgia: Secondary | ICD-10-CM

## 2017-08-25 DIAGNOSIS — M542 Cervicalgia: Secondary | ICD-10-CM

## 2017-08-25 DIAGNOSIS — M545 Low back pain, unspecified: Secondary | ICD-10-CM

## 2017-08-25 MED ORDER — NAPROXEN 500 MG PO TABS: 500.0000 mg | ORAL_TABLET | Freq: Two times a day (BID) | ORAL | 0 refills | Status: DC

## 2017-08-25 MED ORDER — CYCLOBENZAPRINE HCL 10 MG PO TABS: 10.0000 mg | ORAL_TABLET | Freq: Two times a day (BID) | ORAL | 0 refills | Status: DC | PRN
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
## 2021-05-01 ENCOUNTER — Encounter (HOSPITAL_COMMUNITY): Payer: Self-pay | Admitting: Emergency Medicine

## 2021-05-01 ENCOUNTER — Emergency Department (HOSPITAL_COMMUNITY)
Admission: EM | Admit: 2021-05-01 | Discharge: 2021-05-02 | Disposition: A | Discharge status: Home / Self Care | Payer: No Typology Code available for payment source | Attending: Emergency Medicine | Admitting: Emergency Medicine

## 2021-05-01 ENCOUNTER — Emergency Department (HOSPITAL_COMMUNITY): Payer: No Typology Code available for payment source

## 2021-05-01 ENCOUNTER — Other Ambulatory Visit: Payer: Self-pay

## 2021-05-01 DIAGNOSIS — E876 Hypokalemia: Secondary | ICD-10-CM | POA: Insufficient documentation

## 2021-05-01 DIAGNOSIS — Z79899 Other long term (current) drug therapy: Secondary | ICD-10-CM | POA: Diagnosis not present

## 2021-05-01 DIAGNOSIS — N9489 Other specified conditions associated with female genital organs and menstrual cycle: Secondary | ICD-10-CM | POA: Insufficient documentation

## 2021-05-01 DIAGNOSIS — S060X0A Concussion without loss of consciousness, initial encounter: Secondary | ICD-10-CM | POA: Insufficient documentation

## 2021-05-01 DIAGNOSIS — S0990XA Unspecified injury of head, initial encounter: Secondary | ICD-10-CM | POA: Diagnosis present

## 2021-05-01 DIAGNOSIS — W010XXA Fall on same level from slipping, tripping and stumbling without subsequent striking against object, initial encounter: Secondary | ICD-10-CM | POA: Diagnosis not present

## 2021-05-01 LAB — COMPREHENSIVE METABOLIC PANEL
ALT: 32 U/L (ref 0–44)
AST: 30 U/L (ref 15–41)
Albumin: 3.9 g/dL (ref 3.5–5.0)
Alkaline Phosphatase: 74 U/L (ref 38–126)
Anion gap: 9 (ref 5–15)
BUN: 12 mg/dL (ref 6–20)
CO2: 24 mmol/L (ref 22–32)
Calcium: 8.9 mg/dL (ref 8.9–10.3)
Chloride: 103 mmol/L (ref 98–111)
Creatinine, Ser: 0.63 mg/dL (ref 0.44–1.00)
GFR, Estimated: >60 mL/min (ref >60)
Glucose, Bld: 121 mg/dL — ABNORMAL HIGH (ref 70–99)
Potassium: 3.4 mmol/L — ABNORMAL LOW (ref 3.5–5.1)
Sodium: 136 mmol/L (ref 135–145)
Total Bilirubin: 0.6 mg/dL (ref 0.3–1.2)
Total Protein: 7.2 g/dL (ref 6.5–8.1)

## 2021-05-01 LAB — I-STAT CHEM 8, ED
BUN: 14 mg/dL (ref 6–20)
Calcium, Ion: 1.09 mmol/L — ABNORMAL LOW (ref 1.15–1.40)
Chloride: 102 mmol/L (ref 98–111)
Creatinine, Ser: 0.5 mg/dL (ref 0.44–1.00)
Glucose, Bld: 118 mg/dL — ABNORMAL HIGH (ref 70–99)
HCT: 41 % (ref 36.0–46.0)
Hemoglobin: 13.9 g/dL (ref 12.0–15.0)
Potassium: 3.4 mmol/L — ABNORMAL LOW (ref 3.5–5.1)
Sodium: 137 mmol/L (ref 135–145)
TCO2: 26 mmol/L (ref 22–32)

## 2021-05-01 LAB — DIFFERENTIAL
Abs Immature Granulocytes: 0.01 10*3/uL (ref 0.00–0.07)
Basophils Absolute: 0 10*3/uL (ref 0.0–0.1)
Basophils Relative: 1 %
Eosinophils Absolute: 0.1 10*3/uL (ref 0.0–0.5)
Eosinophils Relative: 2 %
Immature Granulocytes: 0 %
Lymphocytes Relative: 49 %
Lymphs Abs: 2.3 10*3/uL (ref 0.7–4.0)
Monocytes Absolute: 0.4 10*3/uL (ref 0.1–1.0)
Monocytes Relative: 8 %
Neutro Abs: 1.9 10*3/uL (ref 1.7–7.7)
Neutrophils Relative %: 40 %

## 2021-05-01 LAB — I-STAT BETA HCG BLOOD, ED (MC, WL, AP ONLY): I-stat hCG, quantitative: <5 m[IU]/mL (ref <5)

## 2021-05-01 LAB — CBG MONITORING, ED: Glucose-Capillary: 112 mg/dL — ABNORMAL HIGH (ref 70–99)

## 2021-05-01 LAB — CBC
HCT: 39.4 % (ref 36.0–46.0)
Hemoglobin: 13.2 g/dL (ref 12.0–15.0)
MCH: 30 pg (ref 26.0–34.0)
MCHC: 33.5 g/dL (ref 30.0–36.0)
MCV: 89.5 fL (ref 80.0–100.0)
Platelets: 251 10*3/uL (ref 150–400)
RBC: 4.4 MIL/uL (ref 3.87–5.11)
RDW: 13.4 % (ref 11.5–15.5)
WBC: 4.7 10*3/uL (ref 4.0–10.5)
nRBC: 0 % (ref 0.0–0.2)

## 2021-05-01 NOTE — ED Triage Notes (Addendum)
Pt here for fall that happened yesterday while at work, pt tripped on a palette and fell backwards, hitting her head, no LOC. Pt was seen and dx w/ concussion, pt states today she has dizziness, blurry vision, nausea, back and neck pain, photophobia. Pt denies PMH, no blood thinners. Pt has hematoma to L side of head ?

## 2021-05-01 NOTE — ED Provider Notes (Signed)
[facility] EMERGENCY DEPARTMENT Provider Note   CSN: [id number] Arrival date & time: 05/01/21  1516     History  Chief Complaint  Patient presents with   Fall   Headache    Stacy Patel is a 61 y.o. female presenting today after tripping and fell over a pallets yesterday afternoon.  She states she fell back and hit her head on the back left side without LOC.  Patient was seen at urgent care and was told to follow-up in the ED if symptoms persisted or worsened.  Complaining of dizziness, headache, blurred vision, neck pain, and photophobia.  No history of head trauma.  Not on anticoagulation.  Denies neck stiffness.  No recent illness, fever, chills.  Denies vomiting but states there is some intermittent mild nausea.  Denies lightheaded or dizziness or palpitations or neurodeficit before tripping/falling.  States it was because she misstepped.  The history is provided by the patient and medical records. The history is limited by a language barrier. A language interpreter was used.  Fall Associated symptoms include headaches.  Headache Associated symptoms: dizziness, nausea and neck pain       Home Medications Prior to Admission medications   Medication Sig Start Date End Date Taking? Authorizing Provider  cyclobenzaprine (FLEXERIL) 10 MG tablet Take 1 tablet (10 mg total) by mouth 2 (two) times daily as needed for muscle spasms. 08/25/17   Wieters, Hallie C, PA-C  DiphenhydrAMINE HCl (BENADRYL PO) Take by mouth.    [provider]  Ibuprofen (ADVIL PO) Take by mouth.    [provider]  levothyroxine (SYNTHROID, LEVOTHROID) 25 MCG tablet Take 25 mcg by mouth daily before breakfast.    [provider]  naproxen (NAPROSYN) 500 MG tablet Take 1 tablet (500 mg total) by mouth 2 (two) times daily. 08/25/17   Wieters, Hallie C, PA-C      Allergies    Patient has no known allergies.    Review of Systems   Review of Systems   Gastrointestinal:  Positive for nausea.  Musculoskeletal:  Positive for neck pain.  Neurological:  Positive for dizziness, light-headedness and headaches.   Physical Exam Updated Vital Signs BP (!) 142/69    Pulse 71    Temp 98.5 F (36.9 C) (Oral)    Resp 15    SpO2 98%  Physical Exam Vitals and nursing note reviewed.  Constitutional:      General: She is not in acute distress.    Appearance: She is well-developed. She is not ill-appearing or diaphoretic.  HENT:     Head: Normocephalic and atraumatic. No raccoon eyes or Battle's sign.      Comments: Hematoma as indicated above with ecchymosis and skin changes Eyes:     General: No visual field deficit.    Extraocular Movements: Extraocular movements intact.     Right eye: No nystagmus.     Left eye: No nystagmus.     Conjunctiva/sclera: Conjunctivae normal.  Cardiovascular:     Rate and Rhythm: Normal rate and regular rhythm.     Heart sounds: Normal heart sounds. No murmur heard. Pulmonary:     Effort: Pulmonary effort is normal. No respiratory distress.     Breath sounds: Normal breath sounds.  Abdominal:     Palpations: Abdomen is soft.     Tenderness: There is no abdominal tenderness.  Musculoskeletal:        General: No swelling.     Cervical back: Neck supple.  Skin:  General: Skin is warm and dry.     Capillary Refill: Capillary refill takes less than 2 seconds.  Neurological:     Mental Status: She is alert and oriented to person, place, and time.     GCS: GCS eye subscore is 4. GCS verbal subscore is 5. GCS motor subscore is 6.     Cranial Nerves: No cranial nerve deficit, dysarthria or facial asymmetry.     Sensory: Sensory deficit (Unknown onset) present.     Motor: Weakness (Unknown onset) present.  Psychiatric:        Mood and Affect: Mood normal.    ED Results / Procedures / Treatments   Labs (all labs ordered are listed, but only abnormal results are displayed) Labs Reviewed  COMPREHENSIVE  METABOLIC PANEL - Abnormal; Notable for the following components:      Result Value   Potassium 3.4 (*)    Glucose, Bld 121 (*)    All other components within normal limits  I-STAT CHEM 8, ED - Abnormal; Notable for the following components:   Potassium 3.4 (*)    Glucose, Bld 118 (*)    Calcium, Ion 1.09 (*)    All other components within normal limits  CBG MONITORING, ED - Abnormal; Notable for the following components:   Glucose-Capillary 112 (*)    All other components within normal limits  CBC  DIFFERENTIAL  I-STAT BETA HCG BLOOD, ED ([facility], [facility], [facility] ONLY)    EKG None  Radiology CT HEAD WO CONTRAST  Result Date: 05/01/2021 CLINICAL DATA:  Trauma.  Fall. EXAM: CT HEAD WITHOUT CONTRAST CT CERVICAL SPINE WITHOUT CONTRAST TECHNIQUE: Multidetector CT imaging of the head and cervical spine was performed following the standard protocol without intravenous contrast. Multiplanar CT image reconstructions of the cervical spine were also generated. RADIATION DOSE REDUCTION: This exam was performed according to the departmental dose-optimization program which includes automated exposure control, adjustment of the mA and/or kV according to patient size and/or use of iterative reconstruction technique. COMPARISON:  Head CT 03/18/2014. FINDINGS: CT HEAD FINDINGS Brain: No evidence of acute infarction, hemorrhage, hydrocephalus, extra-axial collection or mass lesion/mass effect. Vascular: No hyperdense vessel or unexpected calcification. Skull: Normal. Negative for fracture or focal lesion. Sinuses/Orbits: There is mild mucosal thickening of the right maxillary sinus. There is a small air-fluid level in the left maxillary sinus. Other: There is left parietal scalp soft tissue swelling. CT CERVICAL SPINE FINDINGS Alignment: Normal. Skull base and vertebrae: No acute fracture. No primary bone lesion or focal pathologic process. Soft tissues and spinal canal: No prevertebral fluid or swelling. No visible canal  hematoma. Disc levels: There is disc space narrowing and endplate osteophyte formation at C4-C5, C5-C6 and C6-C7 compatible with degenerative change. There is mild central canal stenosis at C5-C6 and C6-C7 secondary to disc bulge. Upper chest: Negative. Other: None. IMPRESSION: 1.  No acute intracranial process. 2. No acute fracture or traumatic subluxation of the cervical spine. 3.  Left maxillary sinusitis. 4.  Degenerative changes of the cervical spine. Electronically Signed   By: Ronney Asters M.D.   On: 05/01/2021 17:04   CT Cervical Spine Wo Contrast  Result Date: 05/01/2021 CLINICAL DATA:  Trauma.  Fall. EXAM: CT HEAD WITHOUT CONTRAST CT CERVICAL SPINE WITHOUT CONTRAST TECHNIQUE: Multidetector CT imaging of the head and cervical spine was performed following the standard protocol without intravenous contrast. Multiplanar CT image reconstructions of the cervical spine were also generated. RADIATION DOSE REDUCTION: This exam was performed according to the departmental  dose-optimization program which includes automated exposure control, adjustment of the mA and/or kV according to patient size and/or use of iterative reconstruction technique. COMPARISON:  Head CT 03/18/2014. FINDINGS: CT HEAD FINDINGS Brain: No evidence of acute infarction, hemorrhage, hydrocephalus, extra-axial collection or mass lesion/mass effect. Vascular: No hyperdense vessel or unexpected calcification. Skull: Normal. Negative for fracture or focal lesion. Sinuses/Orbits: There is mild mucosal thickening of the right maxillary sinus. There is a small air-fluid level in the left maxillary sinus. Other: There is left parietal scalp soft tissue swelling. CT CERVICAL SPINE FINDINGS Alignment: Normal. Skull base and vertebrae: No acute fracture. No primary bone lesion or focal pathologic process. Soft tissues and spinal canal: No prevertebral fluid or swelling. No visible canal hematoma. Disc levels: There is disc space narrowing and endplate  osteophyte formation at C4-C5, C5-C6 and C6-C7 compatible with degenerative change. There is mild central canal stenosis at C5-C6 and C6-C7 secondary to disc bulge. Upper chest: Negative. Other: None. IMPRESSION: 1.  No acute intracranial process. 2. No acute fracture or traumatic subluxation of the cervical spine. 3.  Left maxillary sinusitis. 4.  Degenerative changes of the cervical spine. Electronically Signed   By: Ronney Asters M.D.   On: 05/01/2021 17:04    Procedures Procedures    Medications Ordered in ED Medications - No data to display  ED Course/ Medical Decision Making/ A&P                           Medical Decision Making Amount and/or Complexity of Data Reviewed External Data Reviewed: notes. Labs: ordered. Decision-making details documented in ED Course. Radiology: ordered and independent interpretation performed. Decision-making details documented in ED Course.  Risk OTC drugs. Prescription drug management.   61 y.o. female presents to the ED for concern of Fall and Headache  .  This involves an extensive number of treatment options, and is a complaint that carries with it a high risk of complications and morbidity.  The differential diagnosis includes concussion, TBI, stroke, intracranial hemorrhage,   Comorbidities that complicate the patient evaluation include require translator  Additional history obtained from internal/external records available via epic  Interpretation: I ordered, and personally interpreted labs.  The pertinent results include: Mild hypokalemia.  No evidence of anemia or inflammation/infection.  No evidence of poor renal function or hepatobiliary acute pathology.  I ordered imaging studies including CT of the head and neck and MRI of the brain.  I independently visualized and interpreted CT imaging which showed no acute intracranial process.  I agree with the radiologist interpretation.  MRI pending.  Intervention: I ordered medication  including Ativan for purpose of anxiolytic for MRI scan and to help with headache.  Reevaluation of the patient after these medicines showed that the patient had moderate improvement.  I have reviewed the patients home medicines and have made adjustments as needed  ED Course: Patient presented today with head pain after a ground-level fall yesterday.  Complaining of vision changes, nausea, and dizziness since she fell yesterday.  Observed left-sided weakness of the upper and lower extremities on neuro exam.  Patient unable to determine whether left-sided weakness occurred before or after the fall.  Rest of neuro exam unremarkable.  Was evaluated in urgent care and informed to follow-up with ED if symptoms persisted or worsened.  Denies numbness or tingling to the upper extremities, not suspicious of cervical radiculopathy.  Imaging and physical exam not suggestive of fracture or acute intracranial  hemorrhage.  Neurodeficit of unknown origin and onset at this time.  Considered cardiac cause, but symptoms and presentation not suggestive of this.  Considered musculoskeletal cause of weakness, but patient history and physical exam does not support this.  Headache/pain may be due to benign etiology such as mild concussion.  Overall, I am uncertain the exact etiology of the patient's symptoms.  MRI of brain pending.  Disposition: I discussed the patient and their case with my attending, Dr. Dina Rich, who agreed with the proposed treatment course.  After consideration of the diagnostic results and the patient's response to treatment, I feel that the patient would benefit from further imaging to evaluate for possible stroke or cranial neurologic cause of left sided weakness.  If MRI negative, believe patient may benefit from outpatient follow-up with primary care for close monitoring and re-evaluation.  If positive, further evaluation/treatment may be offered.  Discussed course of treatment thoroughly with the patient  and family member, whom demonstrated understanding.  Patient in agreement and has no further questions.  Care transferred to oncoming provider who will follow up on remaining work-up and will determine disposition.    This chart was dictated using voice recognition software.  Despite best efforts to proofread,  errors can occur which can change the documentation meaning.         Final Clinical Impression(s) / ED Diagnoses Final diagnoses:  Concussion without loss of consciousness, initial encounter    Rx / DC Orders ED Discharge Orders     None         Prince Rome, PA-C 14/38/88 0125    Lorelle Gibbs, DO 05/03/21 1545

## 2021-05-01 NOTE — ED Provider Triage Note (Signed)
Emergency Medicine Provider Triage Evaluation Note ? ?Stacy Patel , a 61 y.o. female  was evaluated in triage.  Pt complains of headache.  Patient states that yesterday she was at work when she was walking over a pallet.  She states that there was a hole in the palate that she could not see and she tripped and fell backward.  She states that she hit the back of her head but denies loss of consciousness.  She states that she was evaluated at urgent care and told that she likely has a concussion and was sent home with pain medication.  She states that she woke up with ongoing headache, hematoma to the back of the head, photophobia, nausea without vomiting, neck pain and musculoskeletal soreness of her back.. ? ?Review of Systems  ?Positive: See above ?Negative:  ? ?Physical Exam  ?BP 138/89 (BP Location: Right Arm)   Pulse 70   Temp 98.5 ?F (36.9 ?C) (Oral)   Resp 16   SpO2 95%  ?Gen:   Awake, no distress   ?Resp:  Normal effort  ?MSK:   Moves extremities without difficulty  ?Other:  No focal neurological deficits, PERRLA, tenderness to palpation of the C-spine.  Hematoma to the left occiput without laceration. ? ?Medical Decision Making  ?Medically screening exam initiated at 4:21 PM.  Appropriate orders placed.  Stacy Patel was informed that the remainder of the evaluation will be completed by another provider, this initial triage assessment does not replace that evaluation, and the importance of remaining in the ED until their evaluation is complete. ? ? ?  ?Stacy Hillier, PA-C ?05/01/21 1622 ? ?

## 2021-05-02 ENCOUNTER — Emergency Department (HOSPITAL_COMMUNITY): Payer: No Typology Code available for payment source

## 2021-05-02 MED ORDER — LORAZEPAM 1 MG PO TABS
0.5000 mg | ORAL_TABLET | Freq: Once | ORAL | Status: AC
  Administered 2021-05-02: 0.5 mg via ORAL
  Filled 2021-05-02: qty 1

## 2021-05-02 NOTE — ED Notes (Signed)
Patient transported to MRI 

## 2021-05-02 NOTE — ED Notes (Signed)
Pt walked to the bathroom and back without assistance - ?

## 2021-05-02 NOTE — ED Provider Notes (Signed)
1:29 AM ?Assumed care from Dr. Dina Rich and Dorise Bullion, please see their note for full history, physical and decision making until this point. In brief this is a 61 y.o. year old female who presented to the ED tonight with Fall and Headache ?    ?Fall with left sided deficits. Pending MRI or AMA.  ? ?MRI looks ok. Nursing has seen her walking back and forth without difficulty multiple times. Appears to be stable for discharge.  ? ?Discharge instructions, including strict return precautions for new or worsening symptoms, given. Patient and/or family verbalized understanding and agreement with the plan as described.  ? ?Labs, studies and imaging reviewed by myself and considered in medical decision making if ordered. Imaging interpreted by radiology. ? ?Labs Reviewed  ?COMPREHENSIVE METABOLIC PANEL - Abnormal; Notable for the following components:  ?    Result Value  ? Potassium 3.4 (*)   ? Glucose, Bld 121 (*)   ? All other components within normal limits  ?I-STAT CHEM 8, ED - Abnormal; Notable for the following components:  ? Potassium 3.4 (*)   ? Glucose, Bld 118 (*)   ? Calcium, Ion 1.09 (*)   ? All other components within normal limits  ?CBG MONITORING, ED - Abnormal; Notable for the following components:  ? Glucose-Capillary 112 (*)   ? All other components within normal limits  ?CBC  ?DIFFERENTIAL  ?I-STAT BETA HCG BLOOD, ED ([facility], [facility], [facility] ONLY)  ? ? ?CT HEAD WO CONTRAST  ?Final Result  ?  ?CT Cervical Spine Wo Contrast  ?Final Result  ?  ?MR Brain Wo Contrast (neuro protocol)    (Results Pending)  ? ? ?No follow-ups on file. ? ?  ?Azrael Huss, Corene Cornea, MD ?05/02/21 0532 ? ?

## 2021-12-01 DIAGNOSIS — Z23 Encounter for immunization: Secondary | ICD-10-CM | POA: Diagnosis not present

## 2022-04-27 ENCOUNTER — Ambulatory Visit
Admission: EM | Admit: 2022-04-27 | Discharge: 2022-04-27 | Disposition: A | Discharge status: Home / Self Care | Payer: BC Managed Care – PPO | Attending: Physician Assistant | Admitting: Physician Assistant

## 2022-04-27 DIAGNOSIS — K5289 Other specified noninfective gastroenteritis and colitis: Secondary | ICD-10-CM

## 2022-04-27 DIAGNOSIS — R112 Nausea with vomiting, unspecified: Secondary | ICD-10-CM | POA: Diagnosis not present

## 2022-04-27 MED ORDER — ONDANSETRON 4 MG PO TBDP
4.0000 mg | ORAL_TABLET | Freq: Once | ORAL | Status: AC
Start: 2022-04-27 — End: 2022-04-27
  Administered 2022-04-27: 4 mg via ORAL

## 2022-04-27 NOTE — Discharge Instructions (Addendum)
Advised to increase fluid intake with clear liquids including Gatorade over the next 48-72 hours.  Advised to observe a bland diet and avoid foods that are spicy, greasy, or fried over the next 48-72 hours.  Advised to take Zofran 4 mg every 6 hours on a regular basis to help decrease stomach cramping and nausea.  Advised follow-up PCP or return to urgent care if symptoms fail to improve.

## 2022-04-27 NOTE — ED Provider Notes (Signed)
[facility]    CSN: [id number] Arrival date & time: 04/27/22  1010      History   Chief Complaint Chief Complaint  Patient presents with   Emesis    HPI Stacy Patel is a 62 y.o. female.   62 year old female presents with nausea and vomiting.  Patient indicates that yesterday afternoon she ate at [employer].  She indicates that about an hour or 2 afterwards she started getting nauseated, stomach cramping, then started throwing up.  Patient indicates that she threw up off and on last night throughout the night with the last episode being around 6:00 this morning.  Patient indicates she did take some baking soda and water which helped calm the nausea.  She relates she did not run fever, no diarrhea, and last urine specimen was early this morning.  She is tolerating fluids well.  She indicates she is still having stomach soreness, mild mid back discomfort due to repeated vomiting.  She is without wheezing, shortness of breath, chest pain.   Emesis   Past Medical History:  Diagnosis Date   Thyroid disease     Patient Active Problem List   Diagnosis Date Noted   Cervical polyp 11/29/2014   Postmenopausal bleeding 11/29/2014    History reviewed. No pertinent surgical history.  OB History   No obstetric history on file.      Home Medications    Prior to Admission medications   Medication Sig Start Date End Date Taking? Authorizing Provider  cyclobenzaprine (FLEXERIL) 10 MG tablet Take 1 tablet (10 mg total) by mouth 2 (two) times daily as needed for muscle spasms. Patient not taking: Reported on 04/27/2022 08/25/17   Wieters, Madelynn Done C, PA-C  DiphenhydrAMINE HCl (BENADRYL PO) Take by mouth. Patient not taking: Reported on 04/27/2022    [provider]  Ibuprofen (ADVIL PO) Take by mouth. Patient not taking: Reported on 04/27/2022    [provider]  levothyroxine (SYNTHROID, LEVOTHROID) 25 MCG tablet Take 25 mcg by mouth daily before  breakfast. Patient not taking: Reported on 04/27/2022    [provider]  naproxen (NAPROSYN) 500 MG tablet Take 1 tablet (500 mg total) by mouth 2 (two) times daily. Patient not taking: Reported on 04/27/2022 08/25/17   Janith Lima, PA-C    Family History History reviewed. No pertinent family history.  Social History Social History   Tobacco Use   Smoking status: Never  Substance Use Topics   Alcohol use: Yes     Allergies   Patient has no known allergies.   Review of Systems Review of Systems  Gastrointestinal:  Positive for vomiting.     Physical Exam Triage Vital Signs ED Triage Vitals  Enc Vitals Group     BP 04/27/22 1047 121/76     Pulse Rate 04/27/22 1047 74     Resp 04/27/22 1047 14     Temp 04/27/22 1047 98.2 F (36.8 C)     Temp Source 04/27/22 1047 Oral     SpO2 04/27/22 1047 98 %     Weight --      Height --      Head Circumference --      Peak Flow --      Pain Score 04/27/22 1046 0     Pain Loc --      Pain Edu? --      Excl. in [place]? --    No data found.  Updated Vital Signs BP 121/76 (BP Location:  Right Arm)   Pulse 74   Temp 98.2 F (36.8 C) (Oral)   Resp 14   SpO2 98%   Visual Acuity Right Eye Distance:   Left Eye Distance:   Bilateral Distance:    Right Eye Near:   Left Eye Near:    Bilateral Near:     Physical Exam Constitutional:      Appearance: Normal appearance.  HENT:     Mouth/Throat:     Mouth: Mucous membranes are moist.     Pharynx: Oropharynx is clear.  Cardiovascular:     Rate and Rhythm: Normal rate and regular rhythm.     Heart sounds: Normal heart sounds.  Pulmonary:     Effort: Pulmonary effort is normal.     Breath sounds: Normal breath sounds and air entry. No wheezing, rhonchi or rales.  Abdominal:     General: Abdomen is flat. Bowel sounds are increased.     Palpations: Abdomen is soft.     Tenderness: There is generalized abdominal tenderness (mild). There is no guarding or rebound.   Lymphadenopathy:     Cervical: No cervical adenopathy.  Neurological:     Mental Status: She is alert.      UC Treatments / Results  Labs (all labs ordered are listed, but only abnormal results are displayed) Labs Reviewed - No data to display  EKG   Radiology No results found.  Procedures Procedures (including critical care time)  Medications Ordered in UC Medications  ondansetron (ZOFRAN-ODT) disintegrating tablet 4 mg (4 mg Oral Given 04/27/22 1119)    Initial Impression / Assessment and Plan / UC Course  I have reviewed the triage vital signs and the nursing notes.  Pertinent labs & imaging results that were available during my care of the patient were reviewed by me and considered in my medical decision making (see chart for details).    Plan: The diagnosis will be treated with the following: 1.  Nausea and vomiting: A.  Zofran 4 mg given in the office today. B.  Zofran 4 mg every 6 hours as needed for nausea and vomiting. 2.  Gastroenteritis: A.  Zofran 4 mg every 6-8 hours as needed for nausea and vomiting. 3.  Advised follow-up PCP or return to urgent care as needed. Final Clinical Impressions(s) / UC Diagnoses   Final diagnoses:  Nausea and vomiting, unspecified vomiting type  Other noninfectious gastroenteritis     Discharge Instructions      Advised to increase fluid intake with clear liquids including Gatorade over the next 48-72 hours.  Advised to observe a bland diet and avoid foods that are spicy, greasy, or fried over the next 48-72 hours.  Advised to take Zofran 4 mg every 6 hours on a regular basis to help decrease stomach cramping and nausea.  Advised follow-up PCP or return to urgent care if symptoms fail to improve.    ED Prescriptions   None    PDMP not reviewed this encounter.   Nyoka Lint, PA-C 04/27/22 1124

## 2022-04-27 NOTE — ED Triage Notes (Signed)
Pt presents with c/o emesis and abdominal swelling after eating [employer] yesterday.

## 2022-05-09 IMAGING — MR MR HEAD W/O CM
12 of 13 series · 44 of 48 positions shown · non-contrast
Comparison: Head CT from yesterday

CLINICAL DATA: TBI. New or progressive neuro deficits with
left-sided weakness.

EXAM:
MRI HEAD WITHOUT CONTRAST
TECHNIQUE: Multiplanar, multiecho pulse sequences of the brain and surrounding
structures were obtained without intravenous contrast.

[Series 5: DWI · axial · 3.0mm · 0.88mm/px · z∈[-94,+62]mm · 9 of 108 slices shown (1 of 4)]
[im 1/108]
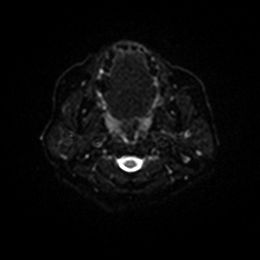
[im 14/108]
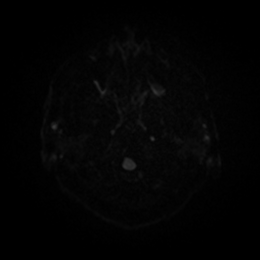
[im 27/108]
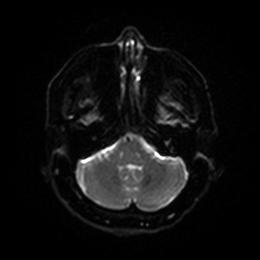
[im 41/108]
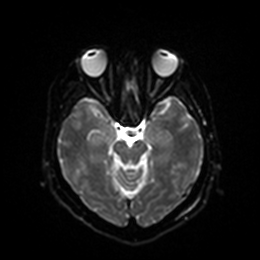
[im 54/108]
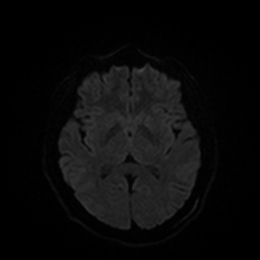
[im 67/108]
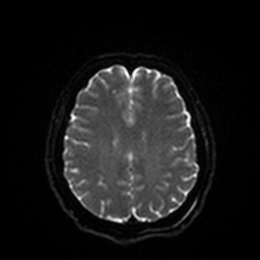
[im 81/108]
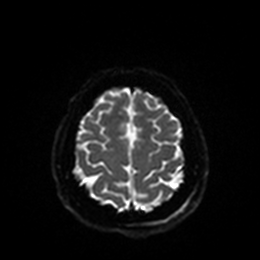
[im 94/108]
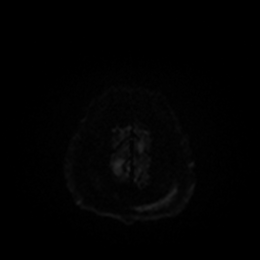
[im 108/108]
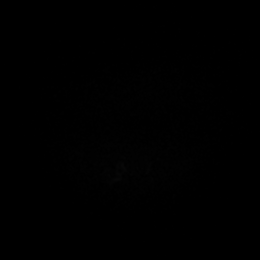

[Series 6: DWI · axial · 3.0mm · 0.88mm/px · z∈[-94,+62]mm · 4 of 54 slices shown (2 of 4)]
[im 1/54]
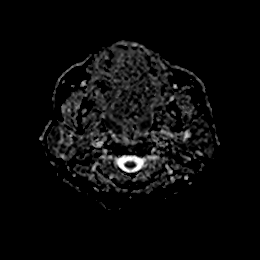
[im 18/54]
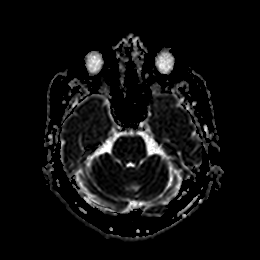
[im 36/54]
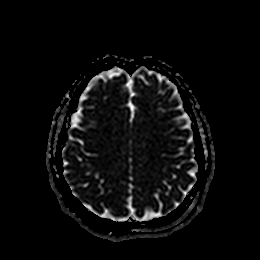
[im 54/54]
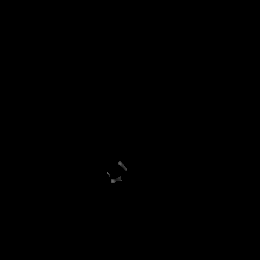

[Series 7: DWI · coronal · 4.0mm · 0.88mm/px · 5 of 72 slices shown (3 of 4)]
[im 1/72]
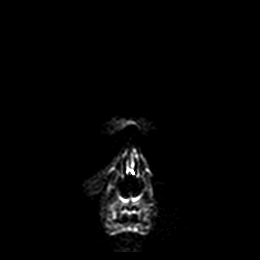
[im 18/72]
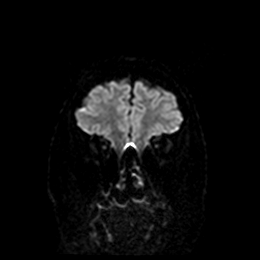
[im 36/72]
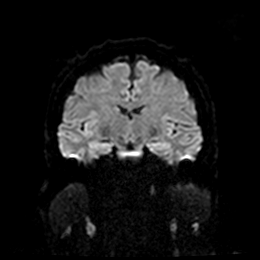
[im 54/72]
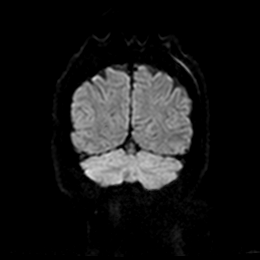
[im 72/72]
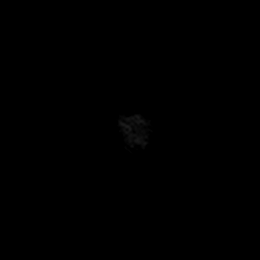

[Series 8: DWI · coronal · 4.0mm · 0.88mm/px · 3 of 36 slices shown (4 of 4)]
[im 1/36]
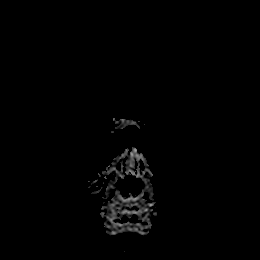
[im 18/36]
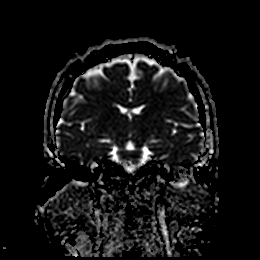
[im 36/36]
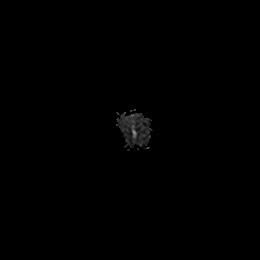

[Series 9: T1 · sagittal · 5.0mm · 0.75mm/px · 2 of 27 slices shown]
[im 1/27]
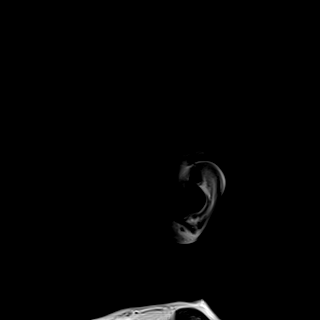
[im 27/27]
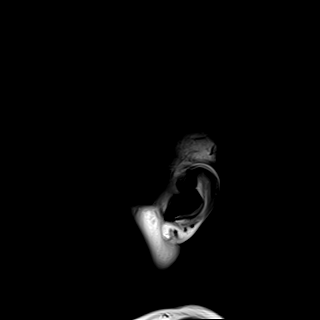

[Series 10: T2 · axial · 5.0mm · 0.72mm/px · z∈[-98,+54]mm · 2 of 27 slices shown (1 of 2)]
[im 1/27]
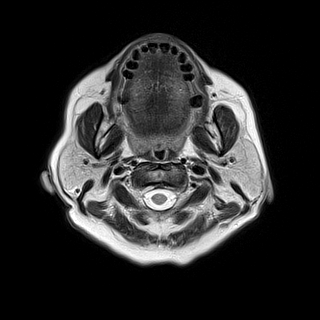
[im 27/27]
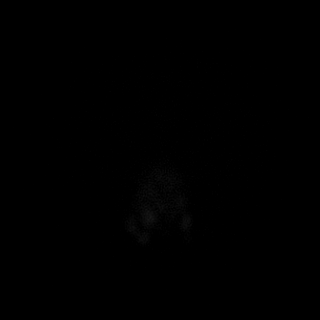

[Series 11: FLAIR · axial · 5.0mm · 0.45mm/px · z∈[-98,+53]mm · 2 of 27 slices shown]
[im 1/27]
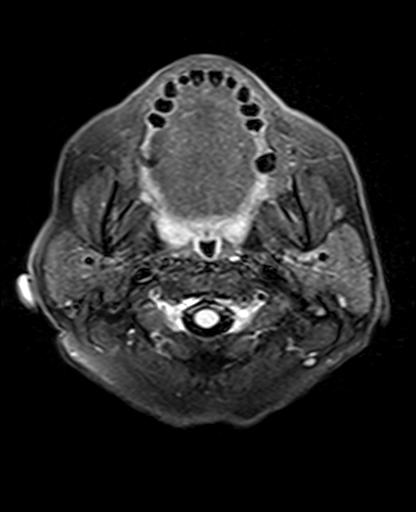
[im 27/27]
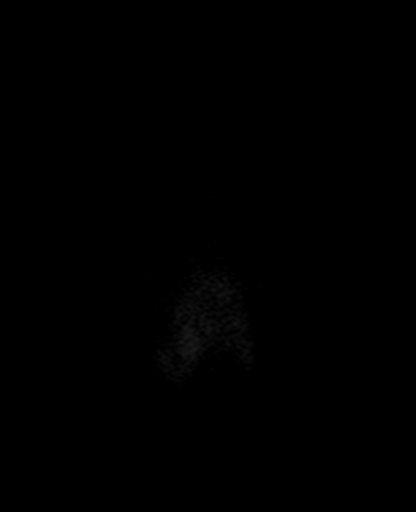

[Series 12: mag_images · axial · 3.0mm · 0.90mm/px · z∈[-92,+58]mm · 4 of 52 slices shown]
[im 1/52]
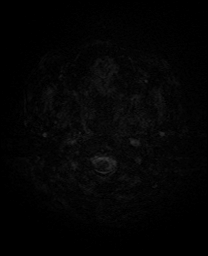
[im 18/52]
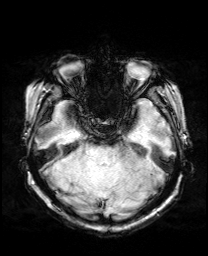
[im 35/52]
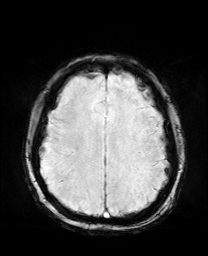
[im 52/52]
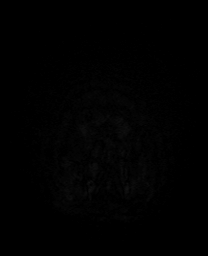

[Series 13: pha_images · axial · 3.0mm · 0.90mm/px · z∈[-92,+58]mm · 4 of 52 slices shown]
[im 1/52]
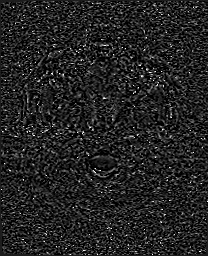
[im 18/52]
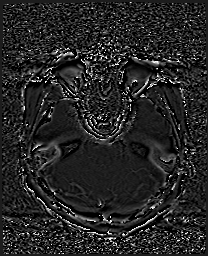
[im 35/52]
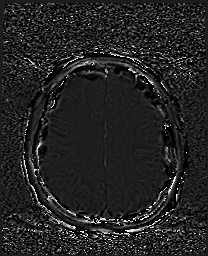
[im 52/52]
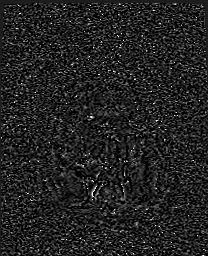

[Series 14: swi_images · axial · 3.0mm · 0.90mm/px · z∈[-92,+58]mm · 4 of 52 slices shown]
[im 1/52]
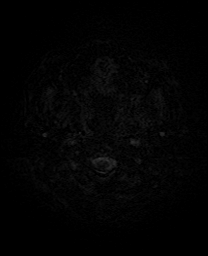
[im 18/52]
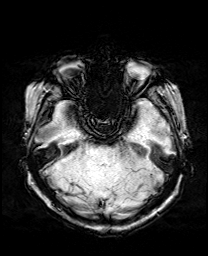
[im 35/52]
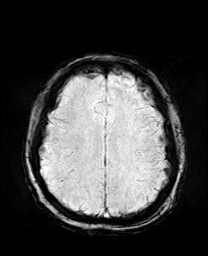
[im 52/52]
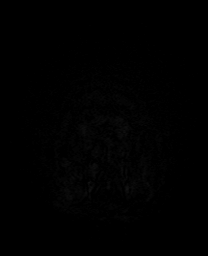

[Series 15: mip_images(sw) · axial · 24.0mm · 0.90mm/px · z∈[-82,+48]mm · 3 of 45 slices shown]
[im 1/45]
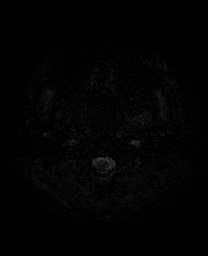
[im 23/45]
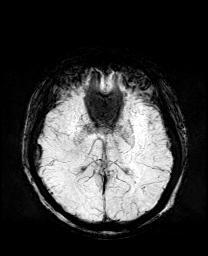
[im 45/45]
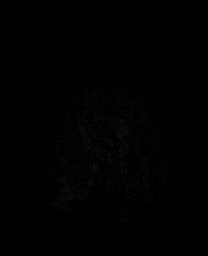

[Series 17: T2 · coronal · 5.0mm · 0.34mm/px · 2 of 30 slices shown (2 of 2)]
[im 1/30]
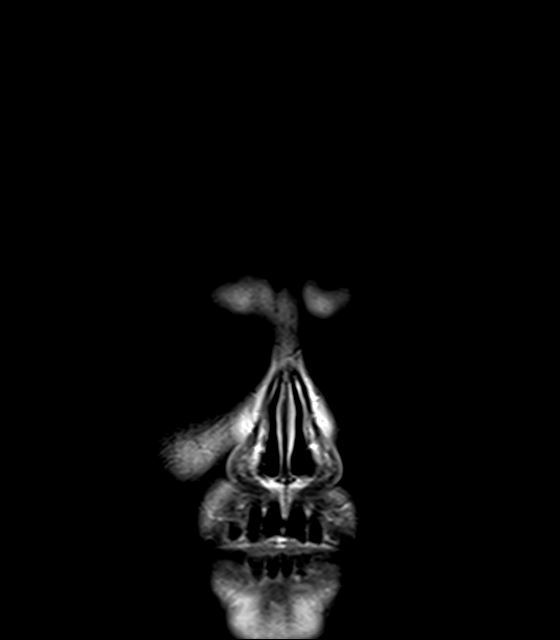
[im 30/30]
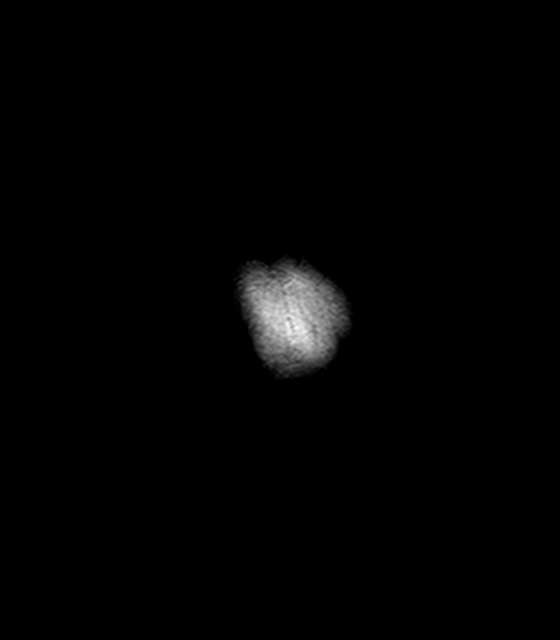

[44 of 48 positions shown; findings below may reference images not displayed]

FINDINGS: Brain: No acute infarction, hemorrhage, hydrocephalus, extra-axial
collection or mass lesion. No brain edema or gliosis. Brain volume
is normal

Vascular: Normal flow voids.

Skull and upper cervical spine: Normal marrow signal. Left parietal
scalp swelling.

Sinuses/Orbits: No visible injury. Mild mucosal thickening along the
floors of the maxillary sinuses.
IMPRESSION: 1. Normal appearance of the brain. No evidence of intracranial
injury.
2. Mild left scalp swelling.

## 2022-06-25 ENCOUNTER — Ambulatory Visit: Payer: Self-pay | Admitting: Family Medicine

## 2022-07-16 ENCOUNTER — Ambulatory Visit: Payer: BC Managed Care – PPO | Admitting: Family Medicine

## 2022-07-16 VITALS — BP 127/81 | HR 71 | Temp 97.6°F | Resp 16 | Wt 143.4 lb

## 2022-07-16 DIAGNOSIS — Z1329 Encounter for screening for other suspected endocrine disorder: Secondary | ICD-10-CM

## 2022-07-16 DIAGNOSIS — Z114 Encounter for screening for human immunodeficiency virus [HIV]: Secondary | ICD-10-CM

## 2022-07-16 DIAGNOSIS — Z130 Encounter for screening for diseases of the blood and blood-forming organs and certain disorders involving the immune mechanism: Secondary | ICD-10-CM | POA: Diagnosis not present

## 2022-07-16 DIAGNOSIS — Z13220 Encounter for screening for lipoid disorders: Secondary | ICD-10-CM | POA: Diagnosis not present

## 2022-07-16 DIAGNOSIS — Z0000 Encounter for general adult medical examination without abnormal findings: Secondary | ICD-10-CM

## 2022-07-16 DIAGNOSIS — Z13228 Encounter for screening for other metabolic disorders: Secondary | ICD-10-CM

## 2022-07-16 DIAGNOSIS — Z1211 Encounter for screening for malignant neoplasm of colon: Secondary | ICD-10-CM

## 2022-07-16 DIAGNOSIS — Z7689 Persons encountering health services in other specified circumstances: Secondary | ICD-10-CM

## 2022-07-16 DIAGNOSIS — Z789 Other specified health status: Secondary | ICD-10-CM

## 2022-07-16 DIAGNOSIS — Z1231 Encounter for screening mammogram for malignant neoplasm of breast: Secondary | ICD-10-CM

## 2022-07-16 DIAGNOSIS — Z1159 Encounter for screening for other viral diseases: Secondary | ICD-10-CM

## 2022-07-16 NOTE — Progress Notes (Signed)
Patient is here to established care with provider today. Patient has many health concern they would like to discuss with provider today  Care gaps discuss at appointment today  

## 2022-07-17 LAB — VITAMIN D 25 HYDROXY (VIT D DEFICIENCY, FRACTURES): Vit D, 25-Hydroxy: 21.6 ng/mL — ABNORMAL LOW (ref 30.0–100.0)

## 2022-07-17 LAB — CMP14+EGFR
ALT: 26 IU/L (ref 0–32)
AST: 24 IU/L (ref 0–40)
Albumin/Globulin Ratio: 1.7 (ref 1.2–2.2)
Albumin: 4.4 g/dL (ref 3.9–4.9)
Alkaline Phosphatase: 83 IU/L (ref 44–121)
BUN/Creatinine Ratio: 11 — ABNORMAL LOW (ref 12–28)
BUN: 7 mg/dL — ABNORMAL LOW (ref 8–27)
Bilirubin Total: 0.5 mg/dL (ref 0.0–1.2)
CO2: 20 mmol/L (ref 20–29)
Calcium: 9.3 mg/dL (ref 8.7–10.3)
Chloride: 106 mmol/L (ref 96–106)
Creatinine, Ser: 0.62 mg/dL (ref 0.57–1.00)
Globulin, Total: 2.6 g/dL (ref 1.5–4.5)
Glucose: 115 mg/dL — ABNORMAL HIGH (ref 70–99)
Potassium: 4 mmol/L (ref 3.5–5.2)
Sodium: 140 mmol/L (ref 134–144)
Total Protein: 7 g/dL (ref 6.0–8.5)
eGFR: 101 mL/min/{1.73_m2} (ref >59)

## 2022-07-17 LAB — CBC WITH DIFFERENTIAL/PLATELET
Basophils Absolute: 0 10*3/uL (ref 0.0–0.2)
Basos: 1 %
EOS (ABSOLUTE): 0.1 10*3/uL (ref 0.0–0.4)
Eos: 1 %
Hematocrit: 41.2 % (ref 34.0–46.6)
Hemoglobin: 13.5 g/dL (ref 11.1–15.9)
Immature Grans (Abs): 0 10*3/uL (ref 0.0–0.1)
Immature Granulocytes: 0 %
Lymphocytes Absolute: 1.7 10*3/uL (ref 0.7–3.1)
Lymphs: 45 %
MCH: 29.3 pg (ref 26.6–33.0)
MCHC: 32.8 g/dL (ref 31.5–35.7)
MCV: 90 fL (ref 79–97)
Monocytes Absolute: 0.3 10*3/uL (ref 0.1–0.9)
Monocytes: 7 %
Neutrophils Absolute: 1.7 10*3/uL (ref 1.4–7.0)
Neutrophils: 46 %
Platelets: 208 10*3/uL (ref 150–450)
RBC: 4.6 x10E6/uL (ref 3.77–5.28)
RDW: 13.4 % (ref 11.7–15.4)
WBC: 3.7 10*3/uL (ref 3.4–10.8)

## 2022-07-17 LAB — LIPID PANEL
Chol/HDL Ratio: 4.1 ratio (ref 0.0–4.4)
Cholesterol, Total: 215 mg/dL — ABNORMAL HIGH (ref 100–199)
HDL: 52 mg/dL (ref >39)
LDL Chol Calc (NIH): 139 mg/dL — ABNORMAL HIGH (ref 0–99)
Triglycerides: 132 mg/dL (ref 0–149)
VLDL Cholesterol Cal: 24 mg/dL (ref 5–40)

## 2022-07-17 LAB — HIV ANTIBODY (ROUTINE TESTING W REFLEX): HIV Screen 4th Generation wRfx: NONREACTIVE

## 2022-07-17 LAB — HEMOGLOBIN A1C
Est. average glucose Bld gHb Est-mCnc: 140 mg/dL
Hgb A1c MFr Bld: 6.5 % — ABNORMAL HIGH (ref 4.8–5.6)

## 2022-07-17 LAB — TSH: TSH: 4.26 u[IU]/mL (ref 0.450–4.500)

## 2022-07-17 LAB — HEPATITIS C ANTIBODY: Hep C Virus Ab: NONREACTIVE

## 2022-07-21 ENCOUNTER — Encounter: Payer: Self-pay | Admitting: Family Medicine

## 2022-07-21 NOTE — Progress Notes (Signed)
New Patient Office Visit  Subjective    Patient ID: Stacy Patel, female    DOB: Sep 10, 1960  Age: 62 y.o. MRN: [id number]  CC:  Chief Complaint  Patient presents with   Establish Care    HPI Stacy Patel presents to establish care and for routine annual exam.Patient denies acute complaints. This visit was aided by an interpreter.    Outpatient Encounter Medications as of 07/16/2022  Medication Sig   levothyroxine (SYNTHROID, LEVOTHROID) 25 MCG tablet Take 25 mcg by mouth daily before breakfast.   [DISCONTINUED] cyclobenzaprine (FLEXERIL) 10 MG tablet Take 1 tablet (10 mg total) by mouth 2 (two) times daily as needed for muscle spasms.   [DISCONTINUED] DiphenhydrAMINE HCl (BENADRYL PO) Take by mouth.   [DISCONTINUED] Ibuprofen (ADVIL PO) Take by mouth.   [DISCONTINUED] naproxen (NAPROSYN) 500 MG tablet Take 1 tablet (500 mg total) by mouth 2 (two) times daily.   No facility-administered encounter medications on file as of 07/16/2022.    Past Medical History:  Diagnosis Date   Thyroid disease     No past surgical history on file.  No family history on file.  Social History   Socioeconomic History   Marital status: Married    Spouse name: Not on file   Number of children: Not on file   Years of education: Not on file   Highest education level: Not on file  Occupational History   Not on file  Tobacco Use   Smoking status: Never   Smokeless tobacco: Not on file  Substance and Sexual Activity   Alcohol use: Yes   Drug use: Not on file   Sexual activity: Not on file  Other Topics Concern   Not on file  Social History Narrative   Not on file   Social Determinants of Health   Financial Resource Strain: Not on file  Food Insecurity: Not on file  Transportation Needs: Not on file  Physical Activity: Not on file  Stress: Not on file  Social Connections: Not on file  Intimate Partner Violence: Not on file    Review of Systems  All other  systems reviewed and are negative.       Objective    BP 127/81   Pulse 71   Temp 97.6 F (36.4 C) (Oral)   Resp 16   Wt 143 lb 6.4 oz (65 kg)   SpO2 95%   BMI 28.96 kg/m   Physical Exam Vitals and nursing note reviewed.  Constitutional:      General: She is not in acute distress. HENT:     Head: Normocephalic and atraumatic.     Right Ear: Tympanic membrane, ear canal and external ear normal.     Left Ear: Tympanic membrane, ear canal and external ear normal.     Nose: Nose normal.     Mouth/Throat:     Mouth: Mucous membranes are moist.     Pharynx: Oropharynx is clear.  Eyes:     Conjunctiva/sclera: Conjunctivae normal.     Pupils: Pupils are equal, round, and reactive to light.  Neck:     Thyroid: No thyromegaly.  Cardiovascular:     Rate and Rhythm: Normal rate and regular rhythm.     Heart sounds: Normal heart sounds. No murmur heard. Pulmonary:     Effort: Pulmonary effort is normal. No respiratory distress.     Breath sounds: Normal breath sounds.  Abdominal:     General: There is no distension.  Palpations: Abdomen is soft. There is no mass.     Tenderness: There is no abdominal tenderness.  Musculoskeletal:        General: Normal range of motion.     Cervical back: Normal range of motion and neck supple.  Skin:    General: Skin is warm and dry.  Neurological:     General: No focal deficit present.     Mental Status: She is alert and oriented to person, place, and time.  Psychiatric:        Mood and Affect: Mood normal.        Behavior: Behavior normal.         Assessment & Plan:   1. Annual physical exam  - CMP14+EGFR  2. Screening for deficiency anemia  - CBC with Differential  3. Screening for lipid disorders  - Lipid Panel  4. Screening for endocrine/metabolic/immunity disorders  - TSH - Hemoglobin A1c - Vitamin D, 25-hydroxy  5. Encounter for screening mammogram for malignant neoplasm of breast  - MM 3D SCREENING  MAMMOGRAM BILATERAL BREAST; Future  6. Screening for HIV (human immunodeficiency virus)  - HIV antibody (with reflex)  7. Need for hepatitis C screening test  - Hepatitis C Antibody  8. Screening for colon cancer  - Cologuard  9. Encounter to establish care   10. Language barrier to communication     No follow-ups on file.   Stacy Raymond, MD

## 2022-08-07 DIAGNOSIS — Z1211 Encounter for screening for malignant neoplasm of colon: Secondary | ICD-10-CM | POA: Diagnosis not present

## 2022-08-14 LAB — COLOGUARD: COLOGUARD: NEGATIVE

## 2022-09-03 ENCOUNTER — Ambulatory Visit: Payer: BC Managed Care – PPO | Admitting: Family Medicine

## 2022-09-03 ENCOUNTER — Encounter: Payer: Self-pay | Admitting: Family Medicine

## 2022-09-03 VITALS — BP 108/71 | HR 66 | Temp 98.1°F | Resp 12 | Ht 59.0 in | Wt 141.6 lb

## 2022-09-03 DIAGNOSIS — L989 Disorder of the skin and subcutaneous tissue, unspecified: Secondary | ICD-10-CM | POA: Diagnosis not present

## 2022-09-03 DIAGNOSIS — Z789 Other specified health status: Secondary | ICD-10-CM

## 2022-09-03 DIAGNOSIS — B360 Pityriasis versicolor: Secondary | ICD-10-CM

## 2022-09-03 MED ORDER — FLUCONAZOLE 150 MG PO TABS: 300.0000 mg | ORAL_TABLET | ORAL | 0 refills | Status: AC

## 2022-09-03 NOTE — Progress Notes (Signed)
Pt is here for f/u   Wants to discuss results from cologuard   Complaining of a bump on the center of her chest, no pain to same but wants to discuss

## 2022-09-07 ENCOUNTER — Encounter: Payer: Self-pay | Admitting: Family Medicine

## 2022-09-07 NOTE — Progress Notes (Signed)
   Established Patient Office Visit  Subjective    Patient ID: Stacy Patel, female    DOB: 09/04/1960  Age: 62 y.o. MRN: [id number]  CC:  Chief Complaint  Patient presents with   Follow-up    HPI Stacy Patel presents to establish care with complaint of skin lesion and rash. Patient denies acute complaints. This visit was aided by an interpreter.    Outpatient Encounter Medications as of 09/03/2022  Medication Sig   fluconazole (DIFLUCAN) 150 MG tablet Take 2 tablets (300 mg total) by mouth every 7 (seven) days for 2 doses.   levothyroxine (SYNTHROID, LEVOTHROID) 25 MCG tablet Take 25 mcg by mouth daily before breakfast.   No facility-administered encounter medications on file as of 09/03/2022.    Past Medical History:  Diagnosis Date   Thyroid disease     History reviewed. No pertinent surgical history.  History reviewed. No pertinent family history.  Social History   Socioeconomic History   Marital status: Married    Spouse name: Not on file   Number of children: Not on file   Years of education: Not on file   Highest education level: Not on file  Occupational History   Not on file  Tobacco Use   Smoking status: Never   Smokeless tobacco: Not on file  Substance and Sexual Activity   Alcohol use: Yes   Drug use: Not on file   Sexual activity: Not on file  Other Topics Concern   Not on file  Social History Narrative   Not on file   Social Determinants of Health   Financial Resource Strain: Not on file  Food Insecurity: Not on file  Transportation Needs: Not on file  Physical Activity: Not on file  Stress: Not on file  Social Connections: Not on file  Intimate Partner Violence: Not on file    Review of Systems  Skin:  Positive for rash. Negative for itching.  All other systems reviewed and are negative.       Objective    BP 108/71 (BP Location: Right Arm, Patient Position: Sitting, Cuff Size: Normal)   Pulse 66   Temp  98.1 F (36.7 C)   Resp 12   Ht 4\' 11"  (1.499 m)   Wt 141 lb 9.6 oz (64.2 kg)   SpO2 98%   BMI 28.60 kg/m   Physical Exam Vitals and nursing note reviewed.  Constitutional:      General: She is not in acute distress. Cardiovascular:     Rate and Rhythm: Normal rate and regular rhythm.  Pulmonary:     Effort: Pulmonary effort is normal.     Breath sounds: Normal breath sounds.  Skin:    Findings: Lesion and rash present.  Neurological:     General: No focal deficit present.     Mental Status: She is alert and oriented to person, place, and time.         Assessment & Plan:   1. Skin lesion of chest wall Referral to derm for further eval/mgt - Ambulatory referral to Dermatology  2. Tinea versicolor Diflucan prescribed.   3. Language barrier to communication   Return if symptoms worsen or fail to improve.   Tommie Raymond, MD

## 2022-12-04 ENCOUNTER — Ambulatory Visit: Payer: BC Managed Care – PPO | Admitting: Family Medicine
